# Patient Record
Sex: Male | Born: 1940
Health system: Southern US, Community
[De-identification: ages and names within clinical notes are randomized; demographics above are authoritative.]

## PROBLEM LIST (undated history)

## (undated) DIAGNOSIS — I10 Essential (primary) hypertension: Secondary | ICD-10-CM

## (undated) DIAGNOSIS — I5043 Acute on chronic combined systolic (congestive) and diastolic (congestive) heart failure: Secondary | ICD-10-CM

## (undated) DIAGNOSIS — J449 Chronic obstructive pulmonary disease, unspecified: Secondary | ICD-10-CM

## (undated) DIAGNOSIS — I509 Heart failure, unspecified: Secondary | ICD-10-CM

## (undated) DIAGNOSIS — I4891 Unspecified atrial fibrillation: Secondary | ICD-10-CM

## (undated) DIAGNOSIS — M1A00X0 Idiopathic chronic gout, unspecified site, without tophus (tophi): Secondary | ICD-10-CM

## (undated) DIAGNOSIS — M109 Gout, unspecified: Secondary | ICD-10-CM

## (undated) DIAGNOSIS — J189 Pneumonia, unspecified organism: Secondary | ICD-10-CM

## (undated) DIAGNOSIS — Z8719 Personal history of other diseases of the digestive system: Secondary | ICD-10-CM

## (undated) HISTORY — PX: LUMBAR DISC SURGERY: SHX700

## (undated) HISTORY — DX: Idiopathic chronic gout, unspecified site, without tophus (tophi): M1A.00X0

## (undated) HISTORY — PX: BACK SURGERY: SHX140

## (undated) HISTORY — DX: Acute on chronic combined systolic (congestive) and diastolic (congestive) heart failure: I50.43

---

## 1998-10-27 HISTORY — PX: CATARACT EXTRACTION W/ INTRAOCULAR LENS  IMPLANT, BILATERAL: SHX1307

## 2014-01-04 ENCOUNTER — Inpatient Hospital Stay (HOSPITAL_COMMUNITY)
Admission: EM | Admit: 2014-01-04 | Discharge: 2014-01-08 | DRG: 308 | Disposition: A | Discharge status: Home / Self Care | Payer: Medicare HMO | Attending: Internal Medicine | Admitting: Internal Medicine

## 2014-01-04 ENCOUNTER — Encounter (HOSPITAL_COMMUNITY): Payer: Self-pay | Admitting: *Deleted

## 2014-01-04 ENCOUNTER — Emergency Department (HOSPITAL_COMMUNITY): Payer: Medicare HMO

## 2014-01-04 DIAGNOSIS — I4891 Unspecified atrial fibrillation: Secondary | ICD-10-CM | POA: Diagnosis present

## 2014-01-04 DIAGNOSIS — T671XXS Heat syncope, sequela: Secondary | ICD-10-CM

## 2014-01-04 DIAGNOSIS — H7092 Unspecified mastoiditis, left ear: Secondary | ICD-10-CM | POA: Diagnosis present

## 2014-01-04 DIAGNOSIS — E43 Unspecified severe protein-calorie malnutrition: Secondary | ICD-10-CM | POA: Diagnosis present

## 2014-01-04 DIAGNOSIS — I959 Hypotension, unspecified: Secondary | ICD-10-CM | POA: Diagnosis not present

## 2014-01-04 DIAGNOSIS — R55 Syncope and collapse: Secondary | ICD-10-CM | POA: Diagnosis present

## 2014-01-04 DIAGNOSIS — S0511XA Contusion of eyeball and orbital tissues, right eye, initial encounter: Secondary | ICD-10-CM | POA: Diagnosis present

## 2014-01-04 DIAGNOSIS — I5033 Acute on chronic diastolic (congestive) heart failure: Secondary | ICD-10-CM | POA: Diagnosis present

## 2014-01-04 DIAGNOSIS — I10 Essential (primary) hypertension: Secondary | ICD-10-CM | POA: Diagnosis present

## 2014-01-04 DIAGNOSIS — R0902 Hypoxemia: Secondary | ICD-10-CM | POA: Diagnosis not present

## 2014-01-04 DIAGNOSIS — F17210 Nicotine dependence, cigarettes, uncomplicated: Secondary | ICD-10-CM | POA: Diagnosis present

## 2014-01-04 DIAGNOSIS — J329 Chronic sinusitis, unspecified: Secondary | ICD-10-CM | POA: Diagnosis present

## 2014-01-04 DIAGNOSIS — E860 Dehydration: Secondary | ICD-10-CM | POA: Diagnosis present

## 2014-01-04 DIAGNOSIS — W1830XA Fall on same level, unspecified, initial encounter: Secondary | ICD-10-CM | POA: Diagnosis present

## 2014-01-04 DIAGNOSIS — M7989 Other specified soft tissue disorders: Secondary | ICD-10-CM

## 2014-01-04 DIAGNOSIS — S0083XA Contusion of other part of head, initial encounter: Secondary | ICD-10-CM | POA: Diagnosis present

## 2014-01-04 HISTORY — DX: Unspecified atrial fibrillation: I48.91

## 2014-01-04 HISTORY — DX: Essential (primary) hypertension: I10

## 2014-01-04 HISTORY — DX: Pneumonia, unspecified organism: J18.9

## 2014-01-04 HISTORY — DX: Gout, unspecified: M10.9

## 2014-01-04 HISTORY — DX: Heart failure, unspecified: I50.9

## 2014-01-04 LAB — CBC WITH DIFFERENTIAL/PLATELET
BASOS ABS: 0.1 10*3/uL (ref 0.0–0.1)
BASOS PCT: 1 % (ref 0–1)
EOS ABS: 0.1 10*3/uL (ref 0.0–0.7)
EOS PCT: 1 % (ref 0–5)
HCT: 54.6 % — ABNORMAL HIGH (ref 39.0–52.0)
HEMOGLOBIN: 19 g/dL — AB (ref 13.0–17.0)
Lymphocytes Relative: 9 % — ABNORMAL LOW (ref 12–46)
Lymphs Abs: 1 10*3/uL (ref 0.7–4.0)
MCH: 32 pg (ref 26.0–34.0)
MCHC: 34.8 g/dL (ref 30.0–36.0)
MCV: 92.1 fL (ref 78.0–100.0)
MONO ABS: 1.1 10*3/uL — AB (ref 0.1–1.0)
MONOS PCT: 10 % (ref 3–12)
Neutro Abs: 8.2 10*3/uL — ABNORMAL HIGH (ref 1.7–7.7)
Neutrophils Relative %: 79 % — ABNORMAL HIGH (ref 43–77)
Platelets: 206 10*3/uL (ref 150–400)
RBC: 5.93 MIL/uL — ABNORMAL HIGH (ref 4.22–5.81)
RDW: 14.8 % (ref 11.5–15.5)
WBC: 10.5 10*3/uL (ref 4.0–10.5)

## 2014-01-04 LAB — COMPREHENSIVE METABOLIC PANEL
ALT: 47 U/L (ref 0–53)
AST: 34 U/L (ref 0–37)
Albumin: 3.9 g/dL (ref 3.5–5.2)
Alkaline Phosphatase: 60 U/L (ref 39–117)
Anion gap: 14 (ref 5–15)
BILIRUBIN TOTAL: 0.7 mg/dL (ref 0.3–1.2)
BUN: 19 mg/dL (ref 6–23)
CHLORIDE: 97 meq/L (ref 96–112)
CO2: 26 mEq/L (ref 19–32)
Calcium: 9.6 mg/dL (ref 8.4–10.5)
Creatinine, Ser: 0.88 mg/dL (ref 0.50–1.35)
GFR calc non Af Amer: 84 mL/min — ABNORMAL LOW (ref >90)
Glucose, Bld: 142 mg/dL — ABNORMAL HIGH (ref 70–99)
Potassium: 4.3 mEq/L (ref 3.7–5.3)
SODIUM: 137 meq/L (ref 137–147)
Total Protein: 7.6 g/dL (ref 6.0–8.3)

## 2014-01-04 LAB — PRO B NATRIURETIC PEPTIDE: Pro B Natriuretic peptide (BNP): 307.3 pg/mL — ABNORMAL HIGH (ref 0–125)

## 2014-01-04 LAB — TROPONIN I
Troponin I: <0.3 ng/mL (ref <0.30)
Troponin I: <0.3 ng/mL (ref <0.30)
Troponin I: <0.3 ng/mL (ref <0.30)

## 2014-01-04 LAB — TSH: TSH: 1.5 u[IU]/mL (ref 0.350–4.500)

## 2014-01-04 LAB — CBG MONITORING, ED: GLUCOSE-CAPILLARY: 145 mg/dL — AB (ref 70–99)

## 2014-01-04 LAB — PROTIME-INR
INR: 1.07 (ref 0.00–1.49)
Prothrombin Time: 14.1 seconds (ref 11.6–15.2)

## 2014-01-04 MED ORDER — ACETAMINOPHEN 325 MG PO TABS: 650.0000 mg | ORAL_TABLET | Freq: Once | ORAL | Status: DC

## 2014-01-04 MED ORDER — METOPROLOL TARTRATE 50 MG PO TABS
50.0000 mg | ORAL_TABLET | Freq: Two times a day (BID) | ORAL | Status: DC
  Administered 2014-01-04 – 2014-01-05 (×2): 50 mg via ORAL
  Filled 2014-01-04 (×5): qty 1

## 2014-01-04 MED ORDER — BENAZEPRIL HCL 20 MG PO TABS
20.0000 mg | ORAL_TABLET | Freq: Every day | ORAL | Status: DC
  Administered 2014-01-04 – 2014-01-05 (×2): 20 mg via ORAL
  Filled 2014-01-04 (×3): qty 1

## 2014-01-04 MED ORDER — ALBUTEROL SULFATE (2.5 MG/3ML) 0.083% IN NEBU
2.5000 mg | INHALATION_SOLUTION | Freq: Four times a day (QID) | RESPIRATORY_TRACT | Status: DC
  Administered 2014-01-04 – 2014-01-06 (×3): 2.5 mg via RESPIRATORY_TRACT
  Filled 2014-01-04 (×6): qty 3

## 2014-01-04 MED ORDER — ONDANSETRON HCL 4 MG/2ML IJ SOLN: 4.0000 mg | Freq: Four times a day (QID) | INTRAMUSCULAR | Status: DC | PRN

## 2014-01-04 MED ORDER — HYDROCODONE-ACETAMINOPHEN 5-325 MG PO TABS: 1.0000 | ORAL_TABLET | ORAL | Status: DC | PRN

## 2014-01-04 MED ORDER — FUROSEMIDE 10 MG/ML IJ SOLN
40.0000 mg | Freq: Two times a day (BID) | INTRAMUSCULAR | Status: DC
  Administered 2014-01-05 – 2014-01-07 (×5): 40 mg via INTRAVENOUS
  Filled 2014-01-04 (×7): qty 4

## 2014-01-04 MED ORDER — POLYETHYLENE GLYCOL 3350 17 G PO PACK
17.0000 g | PACK | Freq: Every day | ORAL | Status: DC | PRN
  Filled 2014-01-04: qty 1

## 2014-01-04 MED ORDER — ONDANSETRON HCL 4 MG PO TABS: 4.0000 mg | ORAL_TABLET | Freq: Four times a day (QID) | ORAL | Status: DC | PRN

## 2014-01-04 MED ORDER — SODIUM CHLORIDE 0.9 % IJ SOLN
3.0000 mL | Freq: Two times a day (BID) | INTRAMUSCULAR | Status: DC
  Administered 2014-01-04 – 2014-01-08 (×8): 3 mL via INTRAVENOUS

## 2014-01-04 MED ORDER — HYDROCODONE-ACETAMINOPHEN 10-325 MG PO TABS
1.0000 | ORAL_TABLET | Freq: Four times a day (QID) | ORAL | Status: DC
  Administered 2014-01-04 – 2014-01-08 (×15): 1 via ORAL
  Filled 2014-01-04 (×15): qty 1

## 2014-01-04 MED ORDER — METOPROLOL TARTRATE 1 MG/ML IV SOLN: 5.0000 mg | INTRAVENOUS | Status: DC | PRN

## 2014-01-04 MED ORDER — HEPARIN SODIUM (PORCINE) 5000 UNIT/ML IJ SOLN
5000.0000 [IU] | Freq: Three times a day (TID) | INTRAMUSCULAR | Status: DC
  Administered 2014-01-04 – 2014-01-06 (×6): 5000 [IU] via SUBCUTANEOUS
  Filled 2014-01-04 (×7): qty 1

## 2014-01-04 MED ORDER — ASPIRIN EC 81 MG PO TBEC
81.0000 mg | DELAYED_RELEASE_TABLET | Freq: Every day | ORAL | Status: DC
  Administered 2014-01-04 – 2014-01-08 (×5): 81 mg via ORAL
  Filled 2014-01-04 (×5): qty 1

## 2014-01-04 MED ORDER — GUAIFENESIN-DM 100-10 MG/5ML PO SYRP
5.0000 mL | ORAL_SOLUTION | ORAL | Status: DC | PRN
  Administered 2014-01-04 – 2014-01-07 (×5): 5 mL via ORAL
  Filled 2014-01-04 (×5): qty 5

## 2014-01-04 MED ORDER — FUROSEMIDE 10 MG/ML IJ SOLN
40.0000 mg | Freq: Once | INTRAMUSCULAR | Status: AC
  Administered 2014-01-04: 40 mg via INTRAVENOUS
  Filled 2014-01-04: qty 4

## 2014-01-04 MED ORDER — IPRATROPIUM-ALBUTEROL 0.5-2.5 (3) MG/3ML IN SOLN
3.0000 mL | Freq: Once | RESPIRATORY_TRACT | Status: AC
  Administered 2014-01-04: 3 mL via RESPIRATORY_TRACT
  Filled 2014-01-04: qty 3

## 2014-01-04 MED ORDER — CETYLPYRIDINIUM CHLORIDE 0.05 % MT LIQD
7.0000 mL | Freq: Two times a day (BID) | OROMUCOSAL | Status: DC
  Administered 2014-01-05 – 2014-01-08 (×7): 7 mL via OROMUCOSAL

## 2014-01-04 NOTE — ED Notes (Signed)
Patient was given a drink of water.

## 2014-01-04 NOTE — ED Provider Notes (Signed)
CSN: [id number]     Arrival date & time 01/04/14  1218 History   First MD Initiated Contact with Patient 01/04/14 1220     No chief complaint on file.    (Consider location/radiation/quality/duration/timing/severity/associated sxs/prior Treatment) HPI  73 year old male presents after a syncopal episode at home. Patient does not remember the event. EMS states family saw him walking without any prodrome passed out for approximate 30 seconds. Patient hit his head on the way down. Patient denies any shortness of breath or chest pain prior to or after the event. No headaches. Patient states last night he was very cold because his power had gone out for approximately 10 hours. He denies using any gas ears or other ancillary heating device. Patient has had a "wet cough" over the past 1 week. States he is a little more short of breath than usual but is usually very short of breath due to his smoking history. Denies any fevers. EMS states he was dizzy when they sat him up. He was found to have a-fib with RVR with rate between 130-170 and was given 20 mg diltiazem bolus and started on drip. HR then into 80s. Patient endorses leg swelling bilaterally for past 5-6 weeks, is currently taking diuretics.  No past medical history on file. No past surgical history on file. No family history on file. History  Substance Use Topics  . Smoking status: Not on file  . Smokeless tobacco: Not on file  . Alcohol Use: Not on file    Review of Systems  Constitutional: Negative for fever.  HENT: Negative for congestion and rhinorrhea.   Respiratory: Positive for cough and shortness of breath.   Cardiovascular: Positive for leg swelling. Negative for chest pain.  Gastrointestinal: Negative for vomiting and abdominal pain.  Neurological: Positive for syncope. Negative for weakness, numbness and headaches.  All other systems reviewed and are negative.     Allergies  Review of patient's allergies indicates not on  file.  Home Medications   Prior to Admission medications   Not on File   There were no vitals taken for this visit. Physical Exam  Constitutional: He is oriented to person, place, and time. He appears well-developed and well-nourished.  HENT:  Head: Normocephalic.    Right Ear: External ear normal.  Left Ear: External ear normal.  Nose: Nose normal.  Eyes: EOM are normal. Pupils are equal, round, and reactive to light. Right eye exhibits no discharge. Left eye exhibits no discharge.  Neck: Neck supple.  Cardiovascular: Normal rate, regular rhythm, normal heart sounds and intact distal pulses.   Pulmonary/Chest: Effort normal. He has decreased breath sounds. He has wheezes.  Abdominal: Soft. He exhibits no distension. There is no tenderness. A hernia is present. Hernia confirmed positive in the ventral area (reducible, no tenderness).  Musculoskeletal: He exhibits edema (bilateral pitting edema in lower extremities, 3+).  Neurological: He is alert and oriented to person, place, and time.  CN 2-12 grossly intact. 5/5 strength in all 4 extremities  Skin: Skin is warm and dry.  Nursing note and vitals reviewed.   ED Course  Procedures (including critical care time) Labs Review Labs Reviewed  COMPREHENSIVE METABOLIC PANEL - Abnormal; Notable for the following:    Glucose, Bld 142 (*)    GFR calc non Af Amer 84 (*)    All other components within normal limits  PRO B NATRIURETIC PEPTIDE - Abnormal; Notable for the following:    Pro B Natriuretic peptide (BNP) 307.3 (*)  All other components within normal limits  CBC WITH DIFFERENTIAL - Abnormal; Notable for the following:    RBC 5.93 (*)    Hemoglobin 19.0 (*)    HCT 54.6 (*)    Neutrophils Relative % 79 (*)    Neutro Abs 8.2 (*)    Lymphocytes Relative 9 (*)    Monocytes Absolute 1.1 (*)    All other components within normal limits  CBG MONITORING, ED - Abnormal; Notable for the following:    Glucose-Capillary 145 (*)      All other components within normal limits  TROPONIN I  PROTIME-INR  TROPONIN I  TROPONIN I  TROPONIN I  TSH  BASIC METABOLIC PANEL  CBC  CBC  CREATININE, SERUM    Imaging Review Ct Head Wo Contrast  01/04/2014   CLINICAL DATA:  Syncope. Fall with bruising to the left base. Initial encounter  EXAM: CT HEAD WITHOUT CONTRAST  CT MAXILLOFACIAL WITHOUT CONTRAST  TECHNIQUE: Multidetector CT imaging of the head and maxillofacial structures were performed using the standard protocol without intravenous contrast. Multiplanar CT image reconstructions of the maxillofacial structures were also generated.  COMPARISON:  None.  FINDINGS: CT HEAD FINDINGS  Skull and Sinuses:There is a subtotal opacification of left mastoid air cells without visible fracture. No septal erosion. There is lobulated soft tissue in the nasopharynx which is the presumed cause, although not asymmetric to the left. This soft tissue could reflect adenoid hypertrophy, but this is unexpected at this age. No erosive changes noted.  No calvarial fracture.  Brain: When accounting for prominent vein along the left cerebral convexity (Labbe) there is no evidence of subdural or other intracranial hemorrhage. No acute infarct, hydrocephalus, or mass lesion. Generalized cerebral volume loss which is likely age related. Very homogeneous triangular density along the right near over the [employer]. There is no evidence of previous craniotomy - this is presumably ossification.  CT MAXILLOFACIAL FINDINGS  Large soft tissue contusion in the left malar region. No acute fracture. There is a remote fracture of the left mandibular body, status post ORIF. No evidence of globe injury or postseptal hematoma. Patient is status post bilateral cataract resection.  Retropharyngeal atherosclerotic carotids.  Left mastoid effusion or mucosal thickening. There is nasopharyngeal lobulated soft tissue, although not eccentric to the left.  IMPRESSION: 1. No acute  intracranial findings. 2. Left facial contusion without facial fracture. 3. Left mastoiditis with thickening of nasopharyngeal soft tissues. Recommend the ENT referral for nasopharynx evaluation.   Electronically Signed   By: Jorje Guild M.D.   On: 01/04/2014 14:11   Dg Chest Port 1 View  01/04/2014   CLINICAL DATA:  73 year old with syncope, shortness of breath and cough  EXAM: PORTABLE CHEST - 1 VIEW  COMPARISON:  None.  FINDINGS: The cardiac silhouette is enlarged. The mediastinal contours are within normal limits. A few scattered atherosclerotic aortic calcifications are present.  There is no focal airspace consolidation, pleural effusion or pneumothorax. There is no overt pulmonary edema. The pulmonary vessels are mildly prominent. Mild bibasilar atelectasis is present.  No acute osseous abnormality is seen.  IMPRESSION: 1. Cardiomegaly without overt pulmonary edema or focal airspace consolidation. 2. Mild pulmonary vascular congestion. 3. Mild bibasilar atelectasis.   Electronically Signed   By: Rosemarie Ax   On: 01/04/2014 13:09   Ct Maxillofacial Wo Cm  01/04/2014   CLINICAL DATA:  Syncope. Fall with bruising to the left base. Initial encounter  EXAM: CT HEAD WITHOUT CONTRAST  CT MAXILLOFACIAL WITHOUT  CONTRAST  TECHNIQUE: Multidetector CT imaging of the head and maxillofacial structures were performed using the standard protocol without intravenous contrast. Multiplanar CT image reconstructions of the maxillofacial structures were also generated.  COMPARISON:  None.  FINDINGS: CT HEAD FINDINGS  Skull and Sinuses:There is a subtotal opacification of left mastoid air cells without visible fracture. No septal erosion. There is lobulated soft tissue in the nasopharynx which is the presumed cause, although not asymmetric to the left. This soft tissue could reflect adenoid hypertrophy, but this is unexpected at this age. No erosive changes noted.  No calvarial fracture.  Brain: When accounting for  prominent vein along the left cerebral convexity (Labbe) there is no evidence of subdural or other intracranial hemorrhage. No acute infarct, hydrocephalus, or mass lesion. Generalized cerebral volume loss which is likely age related. Very homogeneous triangular density along the right near over the [employer]. There is no evidence of previous craniotomy - this is presumably ossification.  CT MAXILLOFACIAL FINDINGS  Large soft tissue contusion in the left malar region. No acute fracture. There is a remote fracture of the left mandibular body, status post ORIF. No evidence of globe injury or postseptal hematoma. Patient is status post bilateral cataract resection.  Retropharyngeal atherosclerotic carotids.  Left mastoid effusion or mucosal thickening. There is nasopharyngeal lobulated soft tissue, although not eccentric to the left.  IMPRESSION: 1. No acute intracranial findings. 2. Left facial contusion without facial fracture. 3. Left mastoiditis with thickening of nasopharyngeal soft tissues. Recommend the ENT referral for nasopharynx evaluation.   Electronically Signed   By: Jorje Guild M.D.   On: 01/04/2014 14:11     EKG Interpretation   Date/Time:  Tuesday January 04 2014 12:38:19 EST Ventricular Rate:  98 PR Interval:    QRS Duration: 133 QT Interval:  382 QTC Calculation: 488 R Axis:   111 Text Interpretation:  Atrial fibrillation Right bundle branch block No old  tracing to compare Confirmed by [place]  MD, [place] ([id number]) on 01/04/2014  1:55:13 PM      MDM   Final diagnoses:  Syncope    Patient with syncope with no prodrome. Likely due to his A. Fib with RVR. This seems to be recurrent after talking with family. No evidence of acute CHF. The patient currently feels well and his A. Fib is controlled after 20 mg diltiazem by EMS. No recurrence RVR. Due to all this, however he will need admission for telemetry obs. Discussed with Dr. Candiss Norse, who will admit.    Ephraim Hamburger, MD 01/04/14 [phone]

## 2014-01-04 NOTE — Progress Notes (Signed)
No orders on patient. Dr. Candiss Norse called to make aware patient on unit. Dr. Candiss Norse states patient is not on list. Flow management called and message left.

## 2014-01-04 NOTE — Plan of Care (Signed)
Problem: Consults Goal: Diabetes Guidelines if Diabetic/Glucose > 140 If diabetic or lab glucose is > 140 mg/dl - Initiate Diabetes/Hyperglycemia Guidelines & Document Interventions  Outcome: Not Applicable Date Met:  01/04/14     

## 2014-01-04 NOTE — ED Notes (Signed)
Patient Brett Carey was [place] was informed.

## 2014-01-04 NOTE — H&P (Addendum)
Patient Demographics  Brett Carey, is a 73 y.o. male  MRN: [id number]   DOB - 12-16-1940  Admit Date - 01/04/2014  Outpatient Primary MD for the patient is Delphina Cahill, MD   With History of -  Past Medical History  Diagnosis Date  . Hypertension   . CHF (congestive heart failure)   . Gout       Past Surgical History  Procedure Laterality Date  . Back surgery      in for   Chief Complaint  Patient presents with  . Loss of Consciousness  . Shortness of Breath  . Irregular Heart Beat     HPI  Brett Carey  is a 73 y.o. male,  History of gout, CHF unspecified chronic, essential hypertension, back surgery, ongoing smoking counseled to quit who was doing fine this morning and went out to take his trash out, while he was coming back to sit on the recliner he passed out, he probably lost his consciousness for a few minutes, EMS was then called, they found him to be in atrial fibrillation RVR and brought him to ER.  In the ER patient was found to have right periorbital hematoma, he was in atrial fibrillation with a rate of around 100, he gives a history of worsening edema in his legs along with exertional shortness of breath and orthopnea, does have a dry cough, denies any chest pain or palpitations, no previous history of CAD or atrial fibrillation, no previous history of syncope, he denies having any seizure-like activity no bowel bladder incontinence or tongue bite. He currently feels fine. No headache, no chest pain, no palpitations, no abdominal pain, no diarrhea, no blood in stool or urine, no focal weakness.    Review of Systems    In addition to the HPI above,   No Fever-chills, No Headache, No changes with Vision or hearing, No problems swallowing food or Liquids, No Chest pain, Cough or  Shortness of Breath, No Abdominal pain, No Nausea or Vommitting, Bowel movements are regular, No Blood in stool or Urine, No dysuria, No new skin rashes or bruises, No new joints pains-aches,  No new weakness, tingling, numbness in any extremity, No recent weight gain or loss, No polyuria, polydypsia or polyphagia, No significant Mental Stressors.  A full 10 point Review of Systems was done, except as stated above, all other Review of Systems were negative.   Social History History  Substance Use Topics  . Smoking status: Current Every Day Smoker  . Smokeless tobacco: Not on file  . Alcohol Use: No      Family History No sudden cardiac death  Prior to Admission medications   Medication Sig Start Date End Date Taking? Authorizing Provider  amLODipine (NORVASC) 10 MG tablet Take 10 mg by mouth at bedtime. 10/29/13  Yes Historical Provider, MD  benazepril (LOTENSIN) 40 MG tablet Take 40 mg by mouth daily. 10/20/13  Yes Historical Provider, MD  furosemide (  LASIX) 20 MG tablet Take 20 mg by mouth daily. 11/08/13  Yes Historical Provider, MD  HYDROcodone-acetaminophen (NORCO) 10-325 MG per tablet Take 1 tablet by mouth every 6 (six) hours. 01/03/14  Yes Historical Provider, MD  metoprolol (LOPRESSOR) 50 MG tablet Take 50 mg by mouth 2 (two) times daily.   Yes Historical Provider, MD  PROAIR HFA 108 (90 BASE) MCG/ACT inhaler Take 2 puffs by mouth daily as needed. 11/08/13  Yes Historical Provider, MD    No Known Allergies  Physical Exam  Vitals  Blood pressure 129/99, pulse 86, temperature 98.1 F (36.7 C), temperature source Oral, resp. rate 20, height 5\' 4"  (1.626 m), weight 114.17 kg (251 lb 11.2 oz), SpO2 92 %.   1. General elderly white male lying in bed in NAD,    2. Normal affect and insight, Not Suicidal or Homicidal, Awake Alert, Oriented X 3.  3. No F.N deficits, ALL C.Nerves Intact, Strength 5/5 all 4 extremities, Sensation intact all 4 extremities, Plantars down  going.  4. Ears and Eyes appear Normal, Conjunctivae clear, PERRLA. Moist Oral Mucosa. Has right periorbital hematoma,  5. Supple Neck, elevated JVD, No cervical lymphadenopathy appriciated, No Carotid Bruits.  6. Symmetrical Chest wall movement, Good air movement bilaterally, bi basilar crackles,  7. Irregular RRR, No Gallops, Rubs or Murmurs, No Parasternal Heave.  8. Positive Bowel Sounds, Abdomen Soft, No tenderness, No organomegaly appriciated,No rebound -guarding or rigidity.  9.  No Cyanosis, Normal Skin Turgor, No Skin Rash or Bruise. 2+ leg edema left more than right,  10. Good muscle tone,  joints appear normal , no effusions, Normal ROM.  11. No Palpable Lymph Nodes in Neck or Axillae     Data Review  CBC  Recent Labs Lab 01/04/14 1251  WBC 10.5  HGB 19.0*  HCT 54.6*  PLT 206  MCV 92.1  MCH 32.0  MCHC 34.8  RDW 14.8  LYMPHSABS 1.0  MONOABS 1.1*  EOSABS 0.1  BASOSABS 0.1   ------------------------------------------------------------------------------------------------------------------  Chemistries   Recent Labs Lab 01/04/14 1251  NA 137  K 4.3  CL 97  CO2 26  GLUCOSE 142*  BUN 19  CREATININE 0.88  CALCIUM 9.6  AST 34  ALT 47  ALKPHOS 60  BILITOT 0.7   ------------------------------------------------------------------------------------------------------------------ estimated creatinine clearance is 88.4 mL/min (by C-G formula based on Cr of 0.88). ------------------------------------------------------------------------------------------------------------------ No results for input(s): TSH, T4TOTAL, T3FREE, THYROIDAB in the last 72 hours.  Invalid input(s): FREET3   Coagulation profile  Recent Labs Lab 01/04/14 1251  INR 1.07   ------------------------------------------------------------------------------------------------------------------- No results for input(s): DDIMER in the last 72  hours. -------------------------------------------------------------------------------------------------------------------  Cardiac Enzymes  Recent Labs Lab 01/04/14 1251  TROPONINI <0.30   ------------------------------------------------------------------------------------------------------------------ Invalid input(s): POCBNP   ---------------------------------------------------------------------------------------------------------------  Urinalysis No results found for: COLORURINE, APPEARANCEUR, LABSPEC, PHURINE, GLUCOSEU, HGBUR, BILIRUBINUR, KETONESUR, PROTEINUR, UROBILINOGEN, NITRITE, LEUKOCYTESUR  ----------------------------------------------------------------------------------------------------------------  Imaging results:   Ct Head Wo Contrast  01/04/2014   CLINICAL DATA:  Syncope. Fall with bruising to the left base. Initial encounter  EXAM: CT HEAD WITHOUT CONTRAST  CT MAXILLOFACIAL WITHOUT CONTRAST  TECHNIQUE: Multidetector CT imaging of the head and maxillofacial structures were performed using the standard protocol without intravenous contrast. Multiplanar CT image reconstructions of the maxillofacial structures were also generated.  COMPARISON:  None.  FINDINGS: CT HEAD FINDINGS  Skull and Sinuses:There is a subtotal opacification of left mastoid air cells without visible fracture. No septal erosion. There is lobulated soft tissue in the nasopharynx which is the presumed  cause, although not asymmetric to the left. This soft tissue could reflect adenoid hypertrophy, but this is unexpected at this age. No erosive changes noted.  No calvarial fracture.  Brain: When accounting for prominent vein along the left cerebral convexity (Labbe) there is no evidence of subdural or other intracranial hemorrhage. No acute infarct, hydrocephalus, or mass lesion. Generalized cerebral volume loss which is likely age related. Very homogeneous triangular density along the right near over the  [employer]. There is no evidence of previous craniotomy - this is presumably ossification.  CT MAXILLOFACIAL FINDINGS  Large soft tissue contusion in the left malar region. No acute fracture. There is a remote fracture of the left mandibular body, status post ORIF. No evidence of globe injury or postseptal hematoma. Patient is status post bilateral cataract resection.  Retropharyngeal atherosclerotic carotids.  Left mastoid effusion or mucosal thickening. There is nasopharyngeal lobulated soft tissue, although not eccentric to the left.  IMPRESSION: 1. No acute intracranial findings. 2. Left facial contusion without facial fracture. 3. Left mastoiditis with thickening of nasopharyngeal soft tissues. Recommend the ENT referral for nasopharynx evaluation.   Electronically Signed   By: Jorje Guild M.D.   On: 01/04/2014 14:11   Dg Chest Port 1 View  01/04/2014   CLINICAL DATA:  73 year old with syncope, shortness of breath and cough  EXAM: PORTABLE CHEST - 1 VIEW  COMPARISON:  None.  FINDINGS: The cardiac silhouette is enlarged. The mediastinal contours are within normal limits. A few scattered atherosclerotic aortic calcifications are present.  There is no focal airspace consolidation, pleural effusion or pneumothorax. There is no overt pulmonary edema. The pulmonary vessels are mildly prominent. Mild bibasilar atelectasis is present.  No acute osseous abnormality is seen.  IMPRESSION: 1. Cardiomegaly without overt pulmonary edema or focal airspace consolidation. 2. Mild pulmonary vascular congestion. 3. Mild bibasilar atelectasis.   Electronically Signed   By: Rosemarie Ax   On: 01/04/2014 13:09       My personal review of EKG: Rhythm Afib, Rate  98 /min,   no Acute ST changes    Assessment & Plan   1. Syncope likely secondary to new onset atrial fibrillation with RVR. Will be admitted to a telemetry bed, check TSH echogram, currently in rate control, on beta blocker which will be continued,  his CHADVASC2 score is 3 or above, he will require long-term and tach violation but currently has a significant right periorbital hematoma which is fresh, for now we'll put him on 81 mg aspirin, if hematoma tolerates aspirin and prophylaxis dose heparin full dose anticoagulation with either non-bolus heparin drip per Eliquis can be considered. We will monitor on telemetry.    2. Acute on chronic CHF nonspecific.  No previous echogram in chart, he has listed CHF but no known type, we'll place him on a heart healthy diet along with fluid restriction,  IV Lasix 40 mg BID , check echogram, continue beta blocker and ACE inhibitor. Monitor daily weights and intake and output.    3. Right facial hematoma and significant left mastoid nasopharyngeal thickening on CT scan. Outpatient ENT follow-up. No fracture noted.    4. Essential hypertension. Continue beta blocker & ACE inhibitor  Monitor.    5. Ongoing smoking. Counseled to quit.    6. Bilateral lower extremity swelling left more than right. Likely seconded to CHF, will check lower activity venous duplex to rule out DVT.      DVT Prophylaxis Heparin   AM Labs Ordered, also please  review Full Orders  Family Communication: Admission, patients condition and plan of care including tests being ordered have been discussed with the patient who indicates understanding and agree with the plan and Code Status.  Code Status Full  Likely DC to Home  Condition Fair  Time spent in minutes :35    SINGH,PRASHANT K M.D on 01/04/2014 at 5:25 PM  Between 7am to 7pm - Pager - [phone]  After 7pm go to [email or web address] - password [email or web address]  And look for the night coverage person covering me after hours  [facility] Office  [phone]

## 2014-01-04 NOTE — ED Notes (Signed)
Patient states increasing sob and edema to legs x few days, patient states he also has had a productive cough x few days, denies fevers/chills,. Patient passed out this am, patient with bruise to left side of face, denies any other injuries, patient in a-fib upon ems arrival, patient received 10 mg cardizem pta  Per ems

## 2014-01-04 NOTE — Progress Notes (Signed)
Patient has arrived to unit room 3E02. Patient alert and oriented and appears to be in no distress at this time. Patient has no complaints at this time.  Caridac monitor placed on patient. Patient appears to be in atrial fib with a rate of 92. Will monitor patient.

## 2014-01-04 NOTE — Plan of Care (Signed)
Problem: Consults Goal: Skin Care Protocol Initiated - if Braden Score 18 or less If consults are not indicated, leave blank or document N/A  Outcome: Not Applicable Date Met:  04/24/38

## 2014-01-04 NOTE — Progress Notes (Signed)
VASCULAR LAB PRELIMINARY  PRELIMINARY  PRELIMINARY  PRELIMINARY  Bilateral lower extremity venous duplex completed.    Preliminary report:  Bilateral:  No obvious evidence of DVT, superficial thrombosis, or Baker's Cyst.   Shashana Fullington, RVS 01/04/2014, 6:47 PM

## 2014-01-05 DIAGNOSIS — E43 Unspecified severe protein-calorie malnutrition: Secondary | ICD-10-CM

## 2014-01-05 DIAGNOSIS — I059 Rheumatic mitral valve disease, unspecified: Secondary | ICD-10-CM

## 2014-01-05 LAB — CBC
HEMATOCRIT: 52.2 % — AB (ref 39.0–52.0)
Hemoglobin: 18.4 g/dL — ABNORMAL HIGH (ref 13.0–17.0)
MCH: 32.1 pg (ref 26.0–34.0)
MCHC: 35.2 g/dL (ref 30.0–36.0)
MCV: 91.1 fL (ref 78.0–100.0)
PLATELETS: 196 10*3/uL (ref 150–400)
RBC: 5.73 MIL/uL (ref 4.22–5.81)
RDW: 15.1 % (ref 11.5–15.5)
WBC: 9.3 10*3/uL (ref 4.0–10.5)

## 2014-01-05 LAB — BASIC METABOLIC PANEL
Anion gap: 14 (ref 5–15)
BUN: 21 mg/dL (ref 6–23)
CO2: 23 mEq/L (ref 19–32)
CREATININE: 0.95 mg/dL (ref 0.50–1.35)
Calcium: 9.2 mg/dL (ref 8.4–10.5)
Chloride: 98 mEq/L (ref 96–112)
GFR calc Af Amer: >90 mL/min (ref >90)
GFR, EST NON AFRICAN AMERICAN: 82 mL/min — AB (ref >90)
Glucose, Bld: 113 mg/dL — ABNORMAL HIGH (ref 70–99)
Potassium: 4.2 mEq/L (ref 3.7–5.3)
Sodium: 135 mEq/L — ABNORMAL LOW (ref 137–147)

## 2014-01-05 LAB — TROPONIN I: Troponin I: <0.3 ng/mL (ref <0.30)

## 2014-01-05 MED ORDER — LIVING BETTER WITH HEART FAILURE BOOK
Freq: Once | Status: AC
  Administered 2014-01-05: 1

## 2014-01-05 MED ORDER — NICOTINE 14 MG/24HR TD PT24
14.0000 mg | MEDICATED_PATCH | Freq: Every day | TRANSDERMAL | Status: DC
  Administered 2014-01-05 – 2014-01-08 (×4): 14 mg via TRANSDERMAL
  Filled 2014-01-05 (×4): qty 1

## 2014-01-05 NOTE — Evaluation (Addendum)
Occupational Therapy Evaluation Patient Details Name: Brett Carey MRN: [id number] DOB: 10-10-1940 Today's Date: 01/05/2014    History of Present Illness 73 y.o. male,  History of gout, CHF unspecified chronic, essential hypertension, back surgery who presents after fall/syncopal episode at home.   Clinical Impression   Pt admitted with above. Pt independent with ADLs, PTA. Feel pt will benefit from acute OT to increase activity tolerance and independence prior to d/c.     Follow Up Recommendations  No OT follow up;Supervision - Intermittent    Equipment Recommendations  None recommended by OT    Recommendations for Other Services       Precautions / Restrictions Precautions Precautions: Fall Restrictions Weight Bearing Restrictions: No      Mobility Bed Mobility               General bed mobility comments: pt sitting EOB. not assessed  Transfers Overall transfer level: Needs assistance   Transfers: Sit to/from Stand Sit to Stand: Supervision              Balance                                            ADL Overall ADL's : Needs assistance/impaired     Grooming: Wash/dry face;Oral care;Applying deodorant;Brushing hair;Set up;Supervision/safety;Standing   Upper Body Bathing: Set up;Supervision/ safety;Standing   Lower Body Bathing: Set up;Supervison/ safety;Sit to/from stand   Upper Body Dressing : Set up;Sitting   Lower Body Dressing: Sit to/from stand;Set up;Supervision/safety   Toilet Transfer: Min guard;Ambulation (chair)   Toileting- Clothing Manipulation and Hygiene: Set up;Supervision/safety;Sit to/from stand   Tub/ Shower Transfer: Min guard;Ambulation (practiced stepping over)   Functional mobility during ADLs: Min guard General ADL Comments: Educated on energy conservation techniques and deep breathing technique. Pt with SOB during session so performed deep breathing technique. Educated on safety (safe  shoewear, rugs/clutter, sitting for most of LB ADLs, recommended wife be with him for tub transfer). Advised pt to quit smoking. Told pt not to get up in room by himself. Practiced simulated tub transfer.     Vision                     Perception     Praxis      Pertinent Vitals/Pain Pain Assessment: No/denies pain; Pt on 4-5 L of O2 in session and O2 at 95% when at sink during ADLs.     Hand Dominance     Extremity/Trunk Assessment Upper Extremity Assessment Upper Extremity Assessment: Overall WFL for tasks assessed   Lower Extremity Assessment Lower Extremity Assessment: Defer to PT evaluation       Communication Communication Communication: No difficulties (mumbles)   Cognition Arousal/Alertness: Awake/alert Behavior During Therapy: WFL for tasks assessed/performed Overall Cognitive Status: Within Functional Limits for tasks assessed                     General Comments       Exercises       Shoulder Instructions      Home Living Family/patient expects to be discharged to:: Private residence Living Arrangements: Spouse/significant other Available Help at Discharge: Family;Available 24 hours/day Type of Home: Mobile home Home Access: Stairs to enter Entrance Stairs-Number of Steps: 3 Entrance Stairs-Rails: None Home Layout: One level     Bathroom Shower/Tub: Tub/shower unit   [employer]  Toilet: Standard (sink close)     Home Equipment: Walker - 2 wheels;Cane - single point (pt reports he has chair he can use in shower)          Prior Functioning/Environment Level of Independence: Independent             OT Diagnosis: Other (comment) (cardiopulmonary status limiting activity)   OT Problem List: Decreased activity tolerance;Impaired balance (sitting and/or standing);Decreased knowledge of use of DME or AE;Decreased knowledge of precautions;Decreased safety awareness;Cardiopulmonary status limiting activity   OT  Treatment/Interventions: Self-care/ADL training;DME and/or AE instruction;Energy conservation;Therapeutic activities;Patient/family education;Balance training    OT Goals(Current goals can be found in the care plan section) Acute Rehab OT Goals Patient Stated Goal: not stated OT Goal Formulation: With patient Time For Goal Achievement: 01/12/14 Potential to Achieve Goals: Good ADL Goals Pt Will Perform Upper Body Bathing: with modified independence;standing;sitting Pt Will Perform Lower Body Bathing: with modified independence;sit to/from stand Pt Will Transfer to Toilet: with modified independence;ambulating Additional ADL Goal #1: Pt will verbalize and demonstrate 3/3 energy conservation techniques.  OT Frequency: Min 2X/week   Barriers to D/C:            Co-evaluation              End of Session Equipment Utilized During Treatment: Gait belt;Oxygen  Activity Tolerance: Patient tolerated treatment well Patient left: in bed;with call bell/phone within reach;with bed alarm set   Time: 1730-1757 OT Time Calculation (min): 27 min Charges:  OT General Charges $OT Visit: 1 Procedure OT Evaluation $Initial OT Evaluation Tier I: 1 Procedure OT Treatments $Self Care/Home Management : 8-22 mins G-CodesBenito Mccreedy OTR/L [phone] 01/05/2014, 6:20 PM

## 2014-01-05 NOTE — Progress Notes (Signed)
Echocardiogram 2D Echocardiogram has been performed.  Brett Carey 01/05/2014, 3:15 PM

## 2014-01-05 NOTE — Progress Notes (Signed)
[facility] PROGRESS NOTE  Toddy Boyd OAC:[id number] DOB: September 20, 1940 DOA: 01/04/2014 PCP: Delphina Cahill, MD  Assessment/Plan: 1. Syncope likely secondary to new onset afib with RVR 1. Currently rate controlled 2. Now on beta blocker, as tolerated 3. CHADS-VASC2 of 3. Will need anticoagulation if no signs of worsening hematoma. Currently on ASA alone 2. Acute on chronic CHF 1. Pt is cont on lasix 40mg  iv bid 2. Thus far, 2500cc out overnight 3. R facial hematoma 1. Stable 2. Monitor for now 4. HTN 1. Stable 2. Monitor 5. Tobacco abuse 1. Cessation done at bedside 2. Pt agreeable to nicotine patch 6. B LE edema 1. Likely secondary to acute CHF per above 7. DVT prophylaxis 1. Heparin subQ  Code Status: Full Family Communication: Pt in room Disposition Plan: Pending   Consultants:    Procedures:    Antibiotics:    HPI/Subjective: No acute events noted overnight  Objective: Filed Vitals:   01/05/14 0141 01/05/14 0630 01/05/14 0831 01/05/14 0900  BP:   112/70 94/62  Pulse:   67 72  Temp: 98.3 F (36.8 C) 97.7 F (36.5 C)    TempSrc: Oral Oral    Resp: 20     Height:      Weight:      SpO2: 95% 99%      Intake/Output Summary (Last 24 hours) at 01/05/14 1256 Last data filed at 01/05/14 1045  Gross per 24 hour  Intake    600 ml  Output    575 ml  Net     25 ml   Filed Weights   01/04/14 1235 01/04/14 1632  Weight: 113.399 kg (250 lb) 114.17 kg (251 lb 11.2 oz)    Exam:   General:  Awake, in nad  Cardiovascular: regular, s1, s2  Respiratory: normal resp effort, no wheezing  Abdomen: soft,nondistended  Musculoskeletal: perfused, no clubbing   Data Reviewed: Basic Metabolic Panel:  Recent Labs Lab 01/04/14 1251 01/05/14 0505  NA 137 135*  K 4.3 4.2  CL 97 98  CO2 26 23  GLUCOSE 142* 113*  BUN 19 21  CREATININE 0.88 0.95  CALCIUM 9.6 9.2   Liver Function Tests:  Recent Labs Lab 01/04/14 1251  AST 34  ALT 47   ALKPHOS 60  BILITOT 0.7  PROT 7.6  ALBUMIN 3.9   No results for input(s): LIPASE, AMYLASE in the last 168 hours. No results for input(s): AMMONIA in the last 168 hours. CBC:  Recent Labs Lab 01/04/14 1251 01/05/14 0505  WBC 10.5 9.3  NEUTROABS 8.2*  --   HGB 19.0* 18.4*  HCT 54.6* 52.2*  MCV 92.1 91.1  PLT 206 196   Cardiac Enzymes:  Recent Labs Lab 01/04/14 1251 01/04/14 1843 01/04/14 2305 01/05/14 0505  TROPONINI <0.30 <0.30 <0.30 <0.30   BNP (last 3 results)  Recent Labs  01/04/14 1250  PROBNP 307.3*   CBG:  Recent Labs Lab 01/04/14 1248  GLUCAP 145*    No results found for this or any previous visit (from the past 240 hour(s)).   Studies: Ct Head Wo Contrast  01/04/2014   CLINICAL DATA:  Syncope. Fall with bruising to the left base. Initial encounter  EXAM: CT HEAD WITHOUT CONTRAST  CT MAXILLOFACIAL WITHOUT CONTRAST  TECHNIQUE: Multidetector CT imaging of the head and maxillofacial structures were performed using the standard protocol without intravenous contrast. Multiplanar CT image reconstructions of the maxillofacial structures were also generated.  COMPARISON:  None.  FINDINGS: CT HEAD FINDINGS  Skull and  Sinuses:There is a subtotal opacification of left mastoid air cells without visible fracture. No septal erosion. There is lobulated soft tissue in the nasopharynx which is the presumed cause, although not asymmetric to the left. This soft tissue could reflect adenoid hypertrophy, but this is unexpected at this age. No erosive changes noted.  No calvarial fracture.  Brain: When accounting for prominent vein along the left cerebral convexity (Labbe) there is no evidence of subdural or other intracranial hemorrhage. No acute infarct, hydrocephalus, or mass lesion. Generalized cerebral volume loss which is likely age related. Very homogeneous triangular density along the right near over the [employer]. There is no evidence of previous craniotomy - this is  presumably ossification.  CT MAXILLOFACIAL FINDINGS  Large soft tissue contusion in the left malar region. No acute fracture. There is a remote fracture of the left mandibular body, status post ORIF. No evidence of globe injury or postseptal hematoma. Patient is status post bilateral cataract resection.  Retropharyngeal atherosclerotic carotids.  Left mastoid effusion or mucosal thickening. There is nasopharyngeal lobulated soft tissue, although not eccentric to the left.  IMPRESSION: 1. No acute intracranial findings. 2. Left facial contusion without facial fracture. 3. Left mastoiditis with thickening of nasopharyngeal soft tissues. Recommend the ENT referral for nasopharynx evaluation.   Electronically Signed   By: Jorje Guild M.D.   On: 01/04/2014 14:11   Dg Chest Port 1 View  01/04/2014   CLINICAL DATA:  73 year old with syncope, shortness of breath and cough  EXAM: PORTABLE CHEST - 1 VIEW  COMPARISON:  None.  FINDINGS: The cardiac silhouette is enlarged. The mediastinal contours are within normal limits. A few scattered atherosclerotic aortic calcifications are present.  There is no focal airspace consolidation, pleural effusion or pneumothorax. There is no overt pulmonary edema. The pulmonary vessels are mildly prominent. Mild bibasilar atelectasis is present.  No acute osseous abnormality is seen.  IMPRESSION: 1. Cardiomegaly without overt pulmonary edema or focal airspace consolidation. 2. Mild pulmonary vascular congestion. 3. Mild bibasilar atelectasis.   Electronically Signed   By: Rosemarie Ax   On: 01/04/2014 13:09   Ct Maxillofacial Wo Cm  01/04/2014   CLINICAL DATA:  Syncope. Fall with bruising to the left base. Initial encounter  EXAM: CT HEAD WITHOUT CONTRAST  CT MAXILLOFACIAL WITHOUT CONTRAST  TECHNIQUE: Multidetector CT imaging of the head and maxillofacial structures were performed using the standard protocol without intravenous contrast. Multiplanar CT image reconstructions of  the maxillofacial structures were also generated.  COMPARISON:  None.  FINDINGS: CT HEAD FINDINGS  Skull and Sinuses:There is a subtotal opacification of left mastoid air cells without visible fracture. No septal erosion. There is lobulated soft tissue in the nasopharynx which is the presumed cause, although not asymmetric to the left. This soft tissue could reflect adenoid hypertrophy, but this is unexpected at this age. No erosive changes noted.  No calvarial fracture.  Brain: When accounting for prominent vein along the left cerebral convexity (Labbe) there is no evidence of subdural or other intracranial hemorrhage. No acute infarct, hydrocephalus, or mass lesion. Generalized cerebral volume loss which is likely age related. Very homogeneous triangular density along the right near over the [employer]. There is no evidence of previous craniotomy - this is presumably ossification.  CT MAXILLOFACIAL FINDINGS  Large soft tissue contusion in the left malar region. No acute fracture. There is a remote fracture of the left mandibular body, status post ORIF. No evidence of globe injury or postseptal hematoma. Patient is status  post bilateral cataract resection.  Retropharyngeal atherosclerotic carotids.  Left mastoid effusion or mucosal thickening. There is nasopharyngeal lobulated soft tissue, although not eccentric to the left.  IMPRESSION: 1. No acute intracranial findings. 2. Left facial contusion without facial fracture. 3. Left mastoiditis with thickening of nasopharyngeal soft tissues. Recommend the ENT referral for nasopharynx evaluation.   Electronically Signed   By: Jorje Guild M.D.   On: 01/04/2014 14:11    Scheduled Meds: . albuterol  2.5 mg Inhalation Q6H  . antiseptic oral rinse  7 mL Mouth Rinse BID  . aspirin EC  81 mg Oral Daily  . benazepril  20 mg Oral Daily  . furosemide  40 mg Intravenous BID  . heparin  5,000 Units Subcutaneous 3 times per day  . HYDROcodone-acetaminophen  1 tablet  Oral Q6H  . Living Better with Heart Failure Book   Does not apply Once  . metoprolol  50 mg Oral BID  . nicotine  14 mg Transdermal Daily  . sodium chloride  3 mL Intravenous Q12H   Continuous Infusions:   Principal Problem:   Syncope Active Problems:   Atrial fibrillation   Facial hematoma   Benign essential HTN  Time spent: 32min  Dewaun Kinzler, [facility] Pager [phone]. If 7PM-7AM, please contact night-coverage at [email or web address], password [facility] 01/05/2014, 12:56 PM  LOS: 1 day

## 2014-01-06 DIAGNOSIS — J9601 Acute respiratory failure with hypoxia: Secondary | ICD-10-CM

## 2014-01-06 LAB — BASIC METABOLIC PANEL
Anion gap: 11 (ref 5–15)
BUN: 18 mg/dL (ref 6–23)
CO2: 27 mEq/L (ref 19–32)
Calcium: 9.2 mg/dL (ref 8.4–10.5)
Chloride: 96 mEq/L (ref 96–112)
Creatinine, Ser: 0.86 mg/dL (ref 0.50–1.35)
GFR, EST NON AFRICAN AMERICAN: 85 mL/min — AB (ref >90)
Glucose, Bld: 111 mg/dL — ABNORMAL HIGH (ref 70–99)
POTASSIUM: 4.5 meq/L (ref 3.7–5.3)
Sodium: 134 mEq/L — ABNORMAL LOW (ref 137–147)

## 2014-01-06 MED ORDER — BENAZEPRIL HCL 20 MG PO TABS
20.0000 mg | ORAL_TABLET | ORAL | Status: AC
Start: 2014-01-06 — End: 2014-01-06
  Administered 2014-01-06: 20 mg via ORAL
  Filled 2014-01-06: qty 1

## 2014-01-06 MED ORDER — METOPROLOL TARTRATE 12.5 MG HALF TABLET
12.5000 mg | ORAL_TABLET | ORAL | Status: AC
  Administered 2014-01-06: 12.5 mg via ORAL
  Filled 2014-01-06: qty 1

## 2014-01-06 MED ORDER — METOPROLOL TARTRATE 50 MG PO TABS: 50.0000 mg | ORAL_TABLET | Freq: Two times a day (BID) | ORAL | Status: DC

## 2014-01-06 MED ORDER — BENAZEPRIL HCL 10 MG PO TABS
10.0000 mg | ORAL_TABLET | Freq: Every day | ORAL | Status: DC
  Filled 2014-01-06: qty 1

## 2014-01-06 MED ORDER — IPRATROPIUM-ALBUTEROL 0.5-2.5 (3) MG/3ML IN SOLN: 3.0000 mL | RESPIRATORY_TRACT | Status: DC | PRN

## 2014-01-06 MED ORDER — BENAZEPRIL HCL 20 MG PO TABS
20.0000 mg | ORAL_TABLET | Freq: Every day | ORAL | Status: DC
  Administered 2014-01-07: 20 mg via ORAL
  Filled 2014-01-06: qty 1

## 2014-01-06 MED ORDER — LEVALBUTEROL HCL 0.63 MG/3ML IN NEBU: 0.6300 mg | INHALATION_SOLUTION | RESPIRATORY_TRACT | Status: DC | PRN

## 2014-01-06 MED ORDER — METOPROLOL TARTRATE 12.5 MG HALF TABLET
12.5000 mg | ORAL_TABLET | Freq: Two times a day (BID) | ORAL | Status: DC
  Filled 2014-01-06 (×2): qty 1

## 2014-01-06 MED ORDER — METHYLPREDNISOLONE SODIUM SUCC 125 MG IJ SOLR
60.0000 mg | INTRAMUSCULAR | Status: DC
  Administered 2014-01-06 – 2014-01-07 (×2): 60 mg via INTRAVENOUS
  Filled 2014-01-06 (×2): qty 0.96

## 2014-01-06 MED ORDER — IPRATROPIUM-ALBUTEROL 0.5-2.5 (3) MG/3ML IN SOLN: 3.0000 mL | RESPIRATORY_TRACT | Status: DC

## 2014-01-06 MED ORDER — IPRATROPIUM-ALBUTEROL 0.5-2.5 (3) MG/3ML IN SOLN
3.0000 mL | RESPIRATORY_TRACT | Status: DC
  Administered 2014-01-06 (×3): 3 mL via RESPIRATORY_TRACT
  Filled 2014-01-06 (×3): qty 3

## 2014-01-06 MED ORDER — APIXABAN 5 MG PO TABS
5.0000 mg | ORAL_TABLET | Freq: Two times a day (BID) | ORAL | Status: DC
  Administered 2014-01-06 – 2014-01-08 (×5): 5 mg via ORAL
  Filled 2014-01-06 (×6): qty 1

## 2014-01-06 MED ORDER — IPRATROPIUM BROMIDE 0.02 % IN SOLN: 0.5000 mg | RESPIRATORY_TRACT | Status: DC | PRN

## 2014-01-06 MED ORDER — METOPROLOL TARTRATE 12.5 MG HALF TABLET
12.5000 mg | ORAL_TABLET | Freq: Two times a day (BID) | ORAL | Status: DC
  Administered 2014-01-06 – 2014-01-07 (×2): 12.5 mg via ORAL
  Filled 2014-01-06 (×3): qty 1

## 2014-01-06 NOTE — Care Management Note (Addendum)
    Page 1 of 2   01/11/2014     10:48:26 AM CARE MANAGEMENT NOTE 01/11/2014  Patient:  Newn[place],Ki   Account Number:  [id number]  Date Initiated:  01/06/2014  Documentation initiated by:  Mariann Laster  Subjective/Objective Assessment:   Snycope, fall r/t RVR, Hematoma under eye, CHF     Action/Plan:   CM to follow for disposition needs   Anticipated DC Date:  01/07/2014   Anticipated DC Plan:  [place]  CM consult  Medication Assistance      PAC Choice  NA   Choice offered to / List presented to:     DME arranged  [facility]      DME agency  [facility].        Status of service:  Completed, signed off Medicare Important Message given?  YES (If response is "NO", the following Medicare IM given date fields will be blank) Date Medicare IM given:  01/07/2014 Medicare IM given by:  Guadalupe Kerekes Date Additional Medicare IM given:   Additional Medicare IM given by:    Discharge Disposition:  HOME/SELF CARE  Per UR Regulation:  Reviewed for med. necessity/level of care/duration of stay  If discussed at [place] of Stay Meetings, dates discussed:    Comments:  Benefits Update [facility] Discharge: Per ---01/11/2014 6213 by Madelin Headings--- per rep at  [employer]: eliquis: $170.62 for 30 day retail/ no auth required/ patient in inital coverage patient can use any retail pharmacy CM faxed this update to Dr. Nelly Laurence office Follow-up Information Follow up with Arnoldo Lenis, MD On 01/13/2014. Specialty: Cardiology 9:20am with Dr Harl Bowie in [place] [address] [phone]     01/08/14 14:00 CM called [facility] DME rep, james to please deliver Home Oxygen tank to room prior to discharge.  No other CM needs were communicated.  Mariane Masters, BSN, CM [phone].  ---01/07/2014 1613 by Mariann Laster--- Insurance update: hard copy placed in shadow chart [employer] RX [place]  Rx  PCN [facility] GRP# [id number] [phone]) MCR RX Drug Coverage CMS [id number]  Benefits update per ---01/07/2014 1115 by Madelin Headings--- [place]  ---01/06/2014 1203 by Mariann Laster--- Benefits Check: apixaban (ELIQUIS) tablet 5 mg po   2 times daily Coverage, co-pay, authorizations, deductibles/donut hole, pharm requirements Thanks, Kieara Schwark RN, BSN, MSHL, CCM  Nurse - Case Manager,  ([facility] [place])  [phone]  01/06/2014   Leasia Swann RN, BSN, MSHL, CCM  Nurse - Case Manager,  (Unit [place])  [phone]  01/06/2014 Social:  From home with wife OT RECS:  None PT RECS:  Pending Specialty med Review:  Eliquis - Benefits check sent but incomplete medication coverage information;  CM spoke with patient but does not have insurance card.  States he will have wife to bring card tomorrow. CM instructed information needed in order to f/u on eliquis Benefits check. CM provided Eliquis 30 day free offer card to patient.  CM had difficulty maintaining patients attention and required re-directing several times during discussion. Dispo Plan:  Home / [place]

## 2014-01-06 NOTE — Evaluation (Signed)
Physical Therapy Evaluation Patient Details Name: Brett Carey MRN: [id number] DOB: 1940/06/23 Today's Date: 01/06/2014   History of Present Illness  73 y.o. male,  History of gout, CHF unspecified chronic, essential hypertension, back surgery who presents after fall/syncopal episode at home.  Clinical Impression  Pt admitted with above. Pt currently with functional limitations due to the deficits listed below (see PT Problem List).  Pt will benefit from skilled PT to increase their independence and safety with mobility to allow discharge home with family.    Follow Up Recommendations No PT follow up    Equipment Recommendations  None recommended by PT    Recommendations for Other Services       Precautions / Restrictions Precautions Precautions: Fall      Mobility  Bed Mobility Overal bed mobility: Modified Independent                Transfers Overall transfer level: Needs assistance Equipment used: None Transfers: Sit to/from Stand Sit to Stand: Supervision            Ambulation/Gait Ambulation/Gait assistance: Min guard Ambulation Distance (Feet): 250 Feet Assistive device: None Gait Pattern/deviations: Step-through pattern;Decreased step length - right;Decreased step length - left;Wide base of support Gait velocity: decr Gait velocity interpretation: Below normal speed for age/gender General Gait Details: Slightly unsteady but no overt loss of balance.  Stairs            Wheelchair Mobility    Modified Rankin (Stroke Patients Only)       Balance Overall balance assessment: Needs assistance Sitting-balance support: No upper extremity supported;Feet supported Sitting balance-Leahy Scale: Normal     Standing balance support: No upper extremity supported Standing balance-Leahy Scale: Good                               Pertinent Vitals/Pain Pain Assessment: No/denies pain    Home Living Family/patient expects to be  discharged to:: Private residence Living Arrangements: Spouse/significant other Available Help at Discharge: Family;Available 24 hours/day Type of Home: Mobile home Home Access: Stairs to enter Entrance Stairs-Rails: None Entrance Stairs-Number of Steps: 3 Home Layout: One level Home Equipment: Walker - 2 wheels;Cane - single point      Prior Function Level of Independence: Independent               Hand Dominance        Extremity/Trunk Assessment   Upper Extremity Assessment: Defer to OT evaluation           Lower Extremity Assessment: Generalized weakness         Communication   Communication: No difficulties  Cognition Arousal/Alertness: Awake/alert Behavior During Therapy: WFL for tasks assessed/performed Overall Cognitive Status: Within Functional Limits for tasks assessed                      General Comments      Exercises        Assessment/Plan    PT Assessment Patient needs continued PT services  PT Diagnosis Difficulty walking;Generalized weakness   PT Problem List Decreased strength;Decreased activity tolerance;Decreased balance;Decreased mobility;Cardiopulmonary status limiting activity  PT Treatment Interventions DME instruction;Balance training;Gait training;Functional mobility training;Therapeutic activities;Therapeutic exercise;Patient/family education   PT Goals (Current goals can be found in the Care Plan section) Acute Rehab PT Goals Patient Stated Goal: return home PT Goal Formulation: With patient Time For Goal Achievement: 01/13/14 Potential to Achieve Goals: Good  Frequency Min 3X/week   Barriers to discharge        Co-evaluation               End of Session   Activity Tolerance: Patient tolerated treatment well Patient left: in chair;with call bell/phone within reach Nurse Communication: Mobility status         Time: 1520-1540 PT Time Calculation (min) (ACUTE ONLY): 20 min   Charges:   PT  Evaluation $Initial PT Evaluation Tier I: 1 Procedure PT Treatments $Gait Training: 8-22 mins   PT G Codes:          Brett Carey 01/08/2014, 4:22 PM  [facility] PT [phone]

## 2014-01-06 NOTE — Progress Notes (Signed)
[facility] PROGRESS NOTE  Brett Carey LKT:[id number] DOB: 06-04-1940 DOA: 01/04/2014 PCP: Delphina Cahill, MD  Assessment/Plan: 1. Syncope likely secondary to new onset afib with RVR 1. Remains rate controlled 2. On beta blocker, as tolerated 3. CHADS-VASC2 of 3. Currently on ASA alone 4. Will start eliquis and monitor closely 2. Acute on chronic CHF 1. Pt is cont on lasix 40mg  iv bid 2. Thus far, wt  From 114->113kg 3. R facial hematoma 1. Stable 2. Monitor for now 4. HTN 1. Stable 2. Monitor 5. Tobacco abuse 1. Cessation done at bedside 2. Cont nicotine patch 6. B LE edema 1. Likely secondary to acute CHF per above 2. diurese 7. DVT prophylaxis 1. Heparin subQ 2. Transition to eliquis 8. Hypoxia 1. No wheezing on exam, but pt with long hx of tobacco abuse 2. Will start scheduled duoneb and IV steroids 3. Currently on 4L [place]  Code Status: Full Family Communication: Pt in room Disposition Plan: Pending   Consultants:    Procedures:    Antibiotics:    HPI/Subjective: Eager to go home  Objective: Filed Vitals:   01/06/14 0627 01/06/14 0721 01/06/14 0943 01/06/14 1024  BP: 113/68  152/113 100/67  Pulse: 84  58 80  Temp: 97.8 F (36.6 C)   98.5 F (36.9 C)  TempSrc: Oral   Oral  Resp:    20  Height:      Weight: 113.2 kg (249 lb 9 oz)     SpO2: 93% 97%  85%    Intake/Output Summary (Last 24 hours) at 01/06/14 1101 Last data filed at 01/06/14 0805  Gross per 24 hour  Intake    700 ml  Output   1550 ml  Net   -850 ml   Filed Weights   01/04/14 1235 01/04/14 1632 01/06/14 0627  Weight: 113.399 kg (250 lb) 114.17 kg (251 lb 11.2 oz) 113.2 kg (249 lb 9 oz)    Exam:   General:  Awake, in nad  Cardiovascular: regular, s1, s2  Respiratory: normal resp effort, no wheezing  Abdomen: soft,nondistended  Musculoskeletal: perfused, no clubbing   Data Reviewed: Basic Metabolic Panel:  Recent Labs Lab 01/04/14 1251 01/05/14 0505  01/06/14 0913  NA 137 135* 134*  K 4.3 4.2 4.5  CL 97 98 96  CO2 26 23 27   GLUCOSE 142* 113* 111*  BUN 19 21 18   CREATININE 0.88 0.95 0.86  CALCIUM 9.6 9.2 9.2   Liver Function Tests:  Recent Labs Lab 01/04/14 1251  AST 34  ALT 47  ALKPHOS 60  BILITOT 0.7  PROT 7.6  ALBUMIN 3.9   No results for input(s): LIPASE, AMYLASE in the last 168 hours. No results for input(s): AMMONIA in the last 168 hours. CBC:  Recent Labs Lab 01/04/14 1251 01/05/14 0505  WBC 10.5 9.3  NEUTROABS 8.2*  --   HGB 19.0* 18.4*  HCT 54.6* 52.2*  MCV 92.1 91.1  PLT 206 196   Cardiac Enzymes:  Recent Labs Lab 01/04/14 1251 01/04/14 1843 01/04/14 2305 01/05/14 0505  TROPONINI <0.30 <0.30 <0.30 <0.30   BNP (last 3 results)  Recent Labs  01/04/14 1250  PROBNP 307.3*   CBG:  Recent Labs Lab 01/04/14 1248  GLUCAP 145*    No results found for this or any previous visit (from the past 240 hour(s)).   Studies: Ct Head Wo Contrast  01/04/2014   CLINICAL DATA:  Syncope. Fall with bruising to the left base. Initial encounter  EXAM: CT HEAD WITHOUT  CONTRAST  CT MAXILLOFACIAL WITHOUT CONTRAST  TECHNIQUE: Multidetector CT imaging of the head and maxillofacial structures were performed using the standard protocol without intravenous contrast. Multiplanar CT image reconstructions of the maxillofacial structures were also generated.  COMPARISON:  None.  FINDINGS: CT HEAD FINDINGS  Skull and Sinuses:There is a subtotal opacification of left mastoid air cells without visible fracture. No septal erosion. There is lobulated soft tissue in the nasopharynx which is the presumed cause, although not asymmetric to the left. This soft tissue could reflect adenoid hypertrophy, but this is unexpected at this age. No erosive changes noted.  No calvarial fracture.  Brain: When accounting for prominent vein along the left cerebral convexity (Labbe) there is no evidence of subdural or other intracranial  hemorrhage. No acute infarct, hydrocephalus, or mass lesion. Generalized cerebral volume loss which is likely age related. Very homogeneous triangular density along the right near over the [employer]. There is no evidence of previous craniotomy - this is presumably ossification.  CT MAXILLOFACIAL FINDINGS  Large soft tissue contusion in the left malar region. No acute fracture. There is a remote fracture of the left mandibular body, status post ORIF. No evidence of globe injury or postseptal hematoma. Patient is status post bilateral cataract resection.  Retropharyngeal atherosclerotic carotids.  Left mastoid effusion or mucosal thickening. There is nasopharyngeal lobulated soft tissue, although not eccentric to the left.  IMPRESSION: 1. No acute intracranial findings. 2. Left facial contusion without facial fracture. 3. Left mastoiditis with thickening of nasopharyngeal soft tissues. Recommend the ENT referral for nasopharynx evaluation.   Electronically Signed   By: Jorje Guild M.D.   On: 01/04/2014 14:11   Dg Chest Port 1 View  01/04/2014   CLINICAL DATA:  73 year old with syncope, shortness of breath and cough  EXAM: PORTABLE CHEST - 1 VIEW  COMPARISON:  None.  FINDINGS: The cardiac silhouette is enlarged. The mediastinal contours are within normal limits. A few scattered atherosclerotic aortic calcifications are present.  There is no focal airspace consolidation, pleural effusion or pneumothorax. There is no overt pulmonary edema. The pulmonary vessels are mildly prominent. Mild bibasilar atelectasis is present.  No acute osseous abnormality is seen.  IMPRESSION: 1. Cardiomegaly without overt pulmonary edema or focal airspace consolidation. 2. Mild pulmonary vascular congestion. 3. Mild bibasilar atelectasis.   Electronically Signed   By: Rosemarie Ax   On: 01/04/2014 13:09   Ct Maxillofacial Wo Cm  01/04/2014   CLINICAL DATA:  Syncope. Fall with bruising to the left base. Initial encounter   EXAM: CT HEAD WITHOUT CONTRAST  CT MAXILLOFACIAL WITHOUT CONTRAST  TECHNIQUE: Multidetector CT imaging of the head and maxillofacial structures were performed using the standard protocol without intravenous contrast. Multiplanar CT image reconstructions of the maxillofacial structures were also generated.  COMPARISON:  None.  FINDINGS: CT HEAD FINDINGS  Skull and Sinuses:There is a subtotal opacification of left mastoid air cells without visible fracture. No septal erosion. There is lobulated soft tissue in the nasopharynx which is the presumed cause, although not asymmetric to the left. This soft tissue could reflect adenoid hypertrophy, but this is unexpected at this age. No erosive changes noted.  No calvarial fracture.  Brain: When accounting for prominent vein along the left cerebral convexity (Labbe) there is no evidence of subdural or other intracranial hemorrhage. No acute infarct, hydrocephalus, or mass lesion. Generalized cerebral volume loss which is likely age related. Very homogeneous triangular density along the right near over the [employer]. There is no evidence  of previous craniotomy - this is presumably ossification.  CT MAXILLOFACIAL FINDINGS  Large soft tissue contusion in the left malar region. No acute fracture. There is a remote fracture of the left mandibular body, status post ORIF. No evidence of globe injury or postseptal hematoma. Patient is status post bilateral cataract resection.  Retropharyngeal atherosclerotic carotids.  Left mastoid effusion or mucosal thickening. There is nasopharyngeal lobulated soft tissue, although not eccentric to the left.  IMPRESSION: 1. No acute intracranial findings. 2. Left facial contusion without facial fracture. 3. Left mastoiditis with thickening of nasopharyngeal soft tissues. Recommend the ENT referral for nasopharynx evaluation.   Electronically Signed   By: Jorje Guild M.D.   On: 01/04/2014 14:11    Scheduled Meds: . antiseptic oral rinse   7 mL Mouth Rinse BID  . apixaban  5 mg Oral BID  . aspirin EC  81 mg Oral Daily  . [START ON 01/07/2014] benazepril  20 mg Oral Daily  . furosemide  40 mg Intravenous BID  . HYDROcodone-acetaminophen  1 tablet Oral Q6H  . ipratropium-albuterol  3 mL Nebulization Q4H  . methylPREDNISolone (SOLU-MEDROL) injection  60 mg Intravenous Q24H  . metoprolol  12.5 mg Oral BID  . nicotine  14 mg Transdermal Daily  . sodium chloride  3 mL Intravenous Q12H   Continuous Infusions:   Principal Problem:   Syncope Active Problems:   Atrial fibrillation   Facial hematoma   Benign essential HTN  Time spent: 43min  Alauna Hayden, [place] [facility] Pager [phone]. If 7PM-7AM, please contact night-coverage at [email or web address], password [facility] 01/06/2014, 11:01 AM  LOS: 2 days

## 2014-01-06 NOTE — Progress Notes (Signed)
ANTICOAGULATION CONSULT NOTE - Initial Consult  Pharmacy Consult for Eliquis Indication: atrial fibrillation  No Known Allergies  Patient Measurements: Height: 5\' 4"  (162.6 cm) Weight: 249 lb 9 oz (113.2 kg) IBW/kg (Calculated) : 59.2  Vital Signs: Temp: 98.5 F (36.9 C) (11/12 1024) Temp Source: Oral (11/12 1024) BP: 100/67 mmHg (11/12 1024) Pulse Rate: 80 (11/12 1024)  Labs:  Recent Labs  01/04/14 1251 01/04/14 1843 01/04/14 2305 01/05/14 0505 01/06/14 0913  HGB 19.0*  --   --  18.4*  --   HCT 54.6*  --   --  52.2*  --   PLT 206  --   --  196  --   LABPROT 14.1  --   --   --   --   INR 1.07  --   --   --   --   CREATININE 0.88  --   --  0.95 0.86  TROPONINI <0.30 <0.30 <0.30 <0.30  --     Estimated Creatinine Clearance: 90 mL/min (by C-G formula based on Cr of 0.86).   Medical History: Past Medical History  Diagnosis Date  . Hypertension   . CHF (congestive heart failure)   . Gout   . Pneumonia X 2  . Atrial fibrillation with RVR 01/04/2014   Assessment:   Eliquis to begin today. SQ heparin stopped.   Right facial/peri-orbital hematoma noted stable.  [place] prior to admission, thought due to syncope.   Patient reports prior use of Coumadin, but he doesn't remember when or for what reason, other than "to this his blood". Also on EC Aspirin 81 mg daily, though he also reports that he doesn't take it every day.  Goal of Therapy:  appropriate Eliquis dose for renal function and indication Monitor platelets by anticoagulation protocol: Yes   Plan:   Eliquis 5 mg BID.  We discussed Eliquis use, precautions, potential drug interactions and monitoring.  Arty Baumgartner, [place] Pager: [phone] 01/06/2014,12:10 PM

## 2014-01-06 NOTE — Discharge Instructions (Signed)
Information on my medicine - ELIQUIS (apixaban)  This medication education was reviewed with me or my healthcare representative as part of my discharge preparation.  The pharmacist that spoke with me during my hospital stay was:  Arty Baumgartner, [facility]  Why was Eliquis prescribed for you? Eliquis was prescribed for you to reduce the risk of a blood clot forming that can cause a stroke if you have a medical condition called atrial fibrillation (a type of irregular heartbeat).  What do You need to know about Eliquis ? Take your 5 mg Eliquis  5 mg TWICE DAILY - one tablet in the morning and one tablet in the evening with or without food. If you have difficulty swallowing the tablet whole please discuss with your pharmacist how to take the medication safely.  Take Eliquis exactly as prescribed by your doctor and DO NOT stop taking Eliquis without talking to the doctor who prescribed the medication.  Stopping may increase your risk of developing a stroke.  Refill your prescription before you run out.  After discharge, you should have regular check-up appointments with your healthcare provider that is prescribing your Eliquis.  In the future your dose may need to be changed if your kidney function or weight changes by a significant amount or as you get older.  What do you do if you miss a dose? If you miss a dose, take it as soon as you remember on the same day and resume taking twice daily.  Do not take more than one dose of ELIQUIS at the same time to make up a missed dose.  Important Safety Information A possible side effect of Eliquis is bleeding. You should call your healthcare provider right away if you experience any of the following: ? Bleeding from an injury or your nose that does not stop. ? Unusual colored urine (red or dark brown) or unusual colored stools (red or black). ? Unusual bruising for unknown reasons. ? A serious fall or if you hit your head (even if there is no  bleeding).  Some medicines may interact with Eliquis and might increase your risk of bleeding or clotting while on Eliquis. To help avoid this, consult your healthcare provider or pharmacist prior to using any new prescription or non-prescription medications, including herbals, vitamins, non-steroidal anti-inflammatory drugs (NSAIDs) and supplements.  This website has more information on Eliquis (apixaban): http://www.eliquis.com/eliquis/home

## 2014-01-06 NOTE — Progress Notes (Signed)
Heart Failure Navigator Consult Note  Presentation: Brett Carey is a 73 y.o. male, History of gout, CHF unspecified chronic, essential hypertension, back surgery, ongoing smoking counseled to quit who was doing fine this morning and went out to take his trash out, while he was coming back to sit on the recliner he passed out, he probably lost his consciousness for a few minutes, EMS was then called, they found him to be in atrial fibrillation RVR and brought him to ER.  In the ER patient was found to have right periorbital hematoma, he was in atrial fibrillation with a rate of around 100, he gives a history of worsening edema in his legs along with exertional shortness of breath and orthopnea, does have a dry cough, denies any chest pain or palpitations, no previous history of CAD or atrial fibrillation, no previous history of syncope, he denies having any seizure-like activity no bowel bladder incontinence or tongue bite. He currently feels fine. No headache, no chest pain, no palpitations, no abdominal pain, no diarrhea, no blood in stool or urine, no focal weakness.   Past Medical History  Diagnosis Date  . Hypertension   . CHF (congestive heart failure)   . Gout   . Pneumonia X 2  . Atrial fibrillation with RVR 01/04/2014    History   Social History  . Marital Status: Married    Spouse Name: N/A    Number of Children: N/A  . Years of Education: N/A   Social History Main Topics  . Smoking status: Current Every Day Smoker -- 0.33 packs/day for 57 years    Types: Cigarettes  . Smokeless tobacco: Former Systems developer    Types: Chew     Comment: "never chewed much"  . Alcohol Use: Yes     Comment: "drank some; stopped in the 1970's"  . Drug Use: No  . Sexual Activity: Not Currently   Other Topics Concern  . None   Social History Narrative  . None    ECHO:Study Conclusions--01/05/14  - Left ventricle: The cavity size was normal. Wall thickness was increased in a pattern of  mild LVH. Septal-lateral dyssynchrony. The estimated ejection fraction was 55%. Indeterminant diastolic function. Although no diagnostic regional wall motion abnormality was identified, this possibility cannot be completely excluded on the basis of this study. - Ventricular septum: D-shaped interventricular septum suggestive of RV pressure/volume overload. - Aortic valve: There was no stenosis. - Aorta: Mildly dilated ascending aorta. Aortic root dimension: 33 mm (ED). Ascending aortic diameter: 39 mm (S). - Mitral valve: There was mild regurgitation. - Left atrium: The atrium was severely dilated. - Right ventricle: The cavity size was moderately dilated. Systolic function was mildly reduced. - Right atrium: The atrium was severely dilated. - Tricuspid valve: Peak RV-RA gradient (S): 22 mm Hg. - Pulmonary arteries: PA peak pressure: 37 mm Hg (S). - Systemic veins: IVC measured 2.3 cm with < 50% respirophasic variation, suggesting RA pressure 15 mmHg.  Impressions:  - The patient was in atrial fibrillation. Normal LV size with mild LV hypertrophy. Septal-lateral dyssynchrony. EF 55%. D-shaped interventricular septum suggestive of RV pressure/volume overload. Moderately dilated RV with mildly decreased systolic function. Mild pulmonary hypertension. Dilated IVC.  Transthoracic echocardiography. M-mode, complete 2D, spectral Doppler, and color Doppler. Birthdate: Patient birthdate: 1940-11-21. Age: Patient is 73 yr old. Sex: Gender: male. BMI: 43.2 kg/m^2. Blood pressure:   94/62 Patient status: Inpatient. Study date: Study   BNP    Component Value Date/Time   PROBNP 307.3*  01/04/2014 1250    Education Assessment and Provision:  Detailed education and instructions provided on heart failure disease management including the following:  Signs and symptoms of Heart Failure When to call the physician Importance of daily weights Low sodium  diet Fluid restriction Medication management Anticipated future follow-up appointments  Patient education given on each of the above topics.  Patient acknowledges understanding and acceptance of all instructions.  I spoke at length with patient's wife and daughter regarding his HF.  I reinforced the importance of daily weights and when to call the physician.  We also discussed a low sodium diet and high sodium foods to avoid.  His wife is adding salt to her cooking and they frequently eat "fried bologna sandwiches" and canned soups.  She acknowledges that they will make changes to their diet.  She would like for him to be seen outpatient at her cardiologist --Dr. Harl Bowie in [place].  I will arrange follow-up for him.  He will return to home with his wife and the daughter lives in very close proximity.   Education Materials:  "Living Better With Heart Failure" Booklet, Daily Weight Tracker Tool .   High Risk Criteria for Readmission and/or Poor Patient Outcomes:   EF <30%--No 55%  2 or more admissions in 6 months- No  Difficult social situation- No  Demonstrates medication noncompliance- No    Barriers of Care:  Knowledge of patient, wife and daughter of HF, compliance related to previous.  Discharge Planning:   Plans to discharge to home with wife and daughter lives close by.  Plans to see [facility] -Dr. Harl Bowie in [place].

## 2014-01-07 LAB — CBC
HCT: 49.6 % (ref 39.0–52.0)
Hemoglobin: 17 g/dL (ref 13.0–17.0)
MCH: 31.1 pg (ref 26.0–34.0)
MCHC: 34.3 g/dL (ref 30.0–36.0)
MCV: 90.7 fL (ref 78.0–100.0)
PLATELETS: 214 10*3/uL (ref 150–400)
RBC: 5.47 MIL/uL (ref 4.22–5.81)
RDW: 14.5 % (ref 11.5–15.5)
WBC: 12.9 10*3/uL — AB (ref 4.0–10.5)

## 2014-01-07 LAB — BASIC METABOLIC PANEL
ANION GAP: 12 (ref 5–15)
BUN: 25 mg/dL — ABNORMAL HIGH (ref 6–23)
CALCIUM: 9.4 mg/dL (ref 8.4–10.5)
CO2: 28 mEq/L (ref 19–32)
Chloride: 94 mEq/L — ABNORMAL LOW (ref 96–112)
Creatinine, Ser: 0.99 mg/dL (ref 0.50–1.35)
GFR, EST NON AFRICAN AMERICAN: 80 mL/min — AB (ref >90)
GLUCOSE: 104 mg/dL — AB (ref 70–99)
POTASSIUM: 5 meq/L (ref 3.7–5.3)
SODIUM: 134 meq/L — AB (ref 137–147)

## 2014-01-07 MED ORDER — FUROSEMIDE 40 MG PO TABS
40.0000 mg | ORAL_TABLET | Freq: Every day | ORAL | Status: DC
  Administered 2014-01-08: 40 mg via ORAL
  Filled 2014-01-07: qty 1

## 2014-01-07 MED ORDER — PREDNISONE 50 MG PO TABS
60.0000 mg | ORAL_TABLET | Freq: Every day | ORAL | Status: DC
  Filled 2014-01-07 (×2): qty 1

## 2014-01-07 MED ORDER — METOPROLOL TARTRATE 25 MG PO TABS
25.0000 mg | ORAL_TABLET | Freq: Two times a day (BID) | ORAL | Status: DC
  Administered 2014-01-07: 25 mg via ORAL
  Filled 2014-01-07 (×3): qty 1

## 2014-01-07 NOTE — Progress Notes (Signed)
[facility] PROGRESS NOTE  Brett Carey HGD:[id number] DOB: Apr 29, 1940 DOA: 01/04/2014 PCP: Brett Cahill, MD  Assessment/Plan: 1. Syncope likely secondary to new onset afib with RVR 1. Had been rate controlled, but HR noted to be in the 140-150's 2. On beta blocker, will increase dose as bp tolerates 3. CHADS-VASC2 of 3. Started eliquis 11/12 and tolerating thus far 2. Acute on chronic CHF 1. Pt was cont on lasix 40mg  iv bid 2. Wt remains stable at 113kg with slight bump in BUN, thus this may be pt's dry weight 3. Will transition to PO lasix daily beginning 11/14 3. R facial hematoma 1. Stable 2. Monitor for now 4. HTN 1. Mildly hypotensive 2. Will d/c ACEI to allow increased beta blocker per above 3. Monitor 5. Tobacco abuse 1. Cessation done at bedside 2. Cont nicotine patch 6. B LE edema 1. Likely secondary to acute CHF per above 2. diurese 7. DVT prophylaxis 1. Heparin subQ 2. Transition to eliquis 8. Hypoxia 1. No wheezing on exam, but pt with long hx of tobacco abuse 2. On nebs and steroids 3. Cont to wean O2 as tolerated 4. Pt meets home O2 requirements, will need education not to smoke with O2  Code Status: Full Family Communication: Pt in room, family at bedside Disposition Plan: Pending   Consultants:    Procedures:    Antibiotics:    HPI/Subjective: Still eager to go home.   Objective: Filed Vitals:   01/07/14 0522 01/07/14 1009 01/07/14 1355 01/07/14 1405  BP: 107/60 114/78 85/52 99/67   Pulse: 76 97 88   Temp: 97.7 F (36.5 C)  98.1 F (36.7 C)   TempSrc: Oral  Oral   Resp: 18  20   Height:      Weight: 113.898 kg (251 lb 1.6 oz)     SpO2: 94%  90%     Intake/Output Summary (Last 24 hours) at 01/07/14 1554 Last data filed at 01/07/14 1405  Gross per 24 hour  Intake   1900 ml  Output   1422 ml  Net    478 ml   Filed Weights   01/04/14 1632 01/06/14 0627 01/07/14 0522  Weight: 114.17 kg (251 lb 11.2 oz) 113.2 kg (249 lb 9  oz) 113.898 kg (251 lb 1.6 oz)    Exam:   General:  Awake, in nad  Cardiovascular: regular, s1, s2  Respiratory: normal resp effort, no wheezing  Abdomen: soft,nondistended  Musculoskeletal: perfused, no clubbing   Data Reviewed: Basic Metabolic Panel:  Recent Labs Lab 01/04/14 1251 01/05/14 0505 01/06/14 0913 01/07/14 0315  NA 137 135* 134* 134*  K 4.3 4.2 4.5 5.0  CL 97 98 96 94*  CO2 26 23 27 28   GLUCOSE 142* 113* 111* 104*  BUN 19 21 18  25*  CREATININE 0.88 0.95 0.86 0.99  CALCIUM 9.6 9.2 9.2 9.4   Liver Function Tests:  Recent Labs Lab 01/04/14 1251  AST 34  ALT 47  ALKPHOS 60  BILITOT 0.7  PROT 7.6  ALBUMIN 3.9   No results for input(s): LIPASE, AMYLASE in the last 168 hours. No results for input(s): AMMONIA in the last 168 hours. CBC:  Recent Labs Lab 01/04/14 1251 01/05/14 0505 01/07/14 0315  WBC 10.5 9.3 12.9*  NEUTROABS 8.2*  --   --   HGB 19.0* 18.4* 17.0  HCT 54.6* 52.2* 49.6  MCV 92.1 91.1 90.7  PLT 206 196 214   Cardiac Enzymes:  Recent Labs Lab 01/04/14 1251 01/04/14 1843 01/04/14 2305  01/05/14 0505  TROPONINI <0.30 <0.30 <0.30 <0.30   BNP (last 3 results)  Recent Labs  01/04/14 1250  PROBNP 307.3*   CBG:  Recent Labs Lab 01/04/14 1248  GLUCAP 145*    No results found for this or any previous visit (from the past 240 hour(s)).   Studies: No results found.  Scheduled Meds: . antiseptic oral rinse  7 mL Mouth Rinse BID  . apixaban  5 mg Oral BID  . aspirin EC  81 mg Oral Daily  . [START ON 01/08/2014] furosemide  40 mg Oral Daily  . HYDROcodone-acetaminophen  1 tablet Oral Q6H  . metoprolol  25 mg Oral BID  . nicotine  14 mg Transdermal Daily  . [START ON 01/08/2014] predniSONE  60 mg Oral Q breakfast  . sodium chloride  3 mL Intravenous Q12H   Continuous Infusions:   Principal Problem:   Syncope Active Problems:   Atrial fibrillation   Facial hematoma   Benign essential HTN  Time spent:  82min  Brett Carey, [place] [facility] Pager [phone]. If 7PM-7AM, please contact night-coverage at [email or web address], password [facility] 01/07/2014, 3:54 PM  LOS: 3 days

## 2014-01-07 NOTE — Progress Notes (Signed)
Have reviewed the heart monitor strip from 1237 with attending physician.

## 2014-01-07 NOTE — Progress Notes (Signed)
Occupational Therapy Treatment and Discharge Patient Details Name: Brett Carey MRN: [id number] DOB: [birth year] Today's Date: 01/07/2014    History of present illness 73 y.o. male,  History of gout, CHF unspecified chronic, essential hypertension, back surgery who presents after fall/syncopal episode at home.   OT comments  This 73 yo male admitted with above presents to acute OT with all education completed with pt and family. Pt's sats at rest on 2 liters 91%, spoke with RN and liters increased from 2 liters to 3 liters. No further OT needs, we will sign off.  Follow Up Recommendations  No OT follow up;Supervision - Intermittent    Equipment Recommendations  None recommended by OT       Precautions / Restrictions Precautions Precautions: Fall Restrictions Weight Bearing Restrictions: No       Mobility Bed Mobility Overal bed mobility: Modified Independent                Transfers Overall transfer level: Independent Equipment used: None Transfers: Sit to/from Stand Sit to Stand: Independent                  ADL                                         General ADL Comments: Pt is able to move around his room and out into the hallway at an Mod independent level (no AD, a normal speed, cues for purse lipped breathing)--do not foresee any issues with pt being able to perform UB/LBB and toilet transfer at a Mod I level (increased time and recommended that he sit on a seat to shower). Educated on purse lipped breathing. Gave pt a handout on energy conservation with those that pertain to him the most highlighted                Cognition   Behavior During Therapy: [facility] for tasks assessed/performed Overall Cognitive Status: Within Functional Limits for tasks assessed                                    Pertinent Vitals/ Pain       Pain Assessment: No/denies pain         Frequency Min 2X/week     Progress Toward  Goals  OT Goals(current goals can now be found in the care plan section)  Progress towards OT goals:  (All education completed)     Plan Discharge plan remains appropriate       End of Session Equipment Utilized During Treatment: Oxygen   Activity Tolerance Patient tolerated treatment well   Patient Left with family/visitor present (sitting EOB)   Nurse Communication  (O2 sats)        Time: 1751-0258 OT Time Calculation (min): 26 min  Charges: OT General Charges $OT Visit: 1 Procedure OT Treatments $Self Care/Home Management : 23-37 mins  Almon Register [phone] 01/07/2014, 10:14 AM

## 2014-01-07 NOTE — Progress Notes (Signed)
SATURATION QUALIFICATIONS: (This note is used to comply with regulatory documentation for home oxygen)  Patient Saturations on Room Air at Rest = 90%  Patient Saturations on Room Air while Ambulating = 85%  Patient Saturations on 2 Liters of oxygen while Ambulating = 91%

## 2014-01-08 DIAGNOSIS — E860 Dehydration: Secondary | ICD-10-CM

## 2014-01-08 LAB — BASIC METABOLIC PANEL
ANION GAP: 12 (ref 5–15)
BUN: 29 mg/dL — ABNORMAL HIGH (ref 6–23)
CHLORIDE: 93 meq/L — AB (ref 96–112)
CO2: 30 mEq/L (ref 19–32)
CREATININE: 0.96 mg/dL (ref 0.50–1.35)
Calcium: 9.5 mg/dL (ref 8.4–10.5)
GFR calc non Af Amer: 81 mL/min — ABNORMAL LOW (ref >90)
Glucose, Bld: 97 mg/dL (ref 70–99)
Potassium: 5.6 mEq/L — ABNORMAL HIGH (ref 3.7–5.3)
SODIUM: 135 meq/L — AB (ref 137–147)

## 2014-01-08 MED ORDER — PREDNISONE 50 MG PO TABS
60.0000 mg | ORAL_TABLET | Freq: Every day | ORAL | Status: DC
  Administered 2014-01-08: 60 mg via ORAL
  Filled 2014-01-08 (×2): qty 1

## 2014-01-08 MED ORDER — APIXABAN 5 MG PO TABS: 5.0000 mg | ORAL_TABLET | Freq: Two times a day (BID) | ORAL | Status: DC

## 2014-01-08 MED ORDER — COLCHICINE 0.6 MG PO TABS: 0.6000 mg | ORAL_TABLET | Freq: Two times a day (BID) | ORAL | Status: DC

## 2014-01-08 MED ORDER — COLCHICINE 0.6 MG PO TABS
0.6000 mg | ORAL_TABLET | Freq: Two times a day (BID) | ORAL | Status: DC
  Administered 2014-01-08: 0.6 mg via ORAL
  Filled 2014-01-08 (×2): qty 1

## 2014-01-08 MED ORDER — COLCHICINE 0.6 MG PO TABS
0.6000 mg | ORAL_TABLET | Freq: Two times a day (BID) | ORAL | Status: AC
Start: 2014-01-08 — End: ?

## 2014-01-08 MED ORDER — FUROSEMIDE 40 MG PO TABS: 40.0000 mg | ORAL_TABLET | Freq: Every day | ORAL | Status: DC

## 2014-01-08 MED ORDER — PREDNISONE 20 MG PO TABS
60.0000 mg | ORAL_TABLET | Freq: Every day | ORAL | Status: DC
Start: 2014-01-08 — End: 2017-03-22

## 2014-01-08 NOTE — Progress Notes (Signed)
SATURATION QUALIFICATIONS: (This note is used to comply with regulatory documentation for home oxygen)  Patient Saturations on Room Air at Rest = 91%  Patient Saturations on Room Air while Ambulating = 88%  Patient Saturations on 2 Liters of oxygen while Ambulating =96 %  Please briefly explain why patient needs home oxygen: Pt SOB with ambulation

## 2014-01-08 NOTE — Discharge Summary (Addendum)
Physician Discharge Summary  Brett Carey ACZ:[id number] DOB: 1940/12/23 DOA: 01/04/2014  PCP: Brett Cahill, MD  Admit date: 01/04/2014 Discharge date: 01/08/2014  Time spent: 35 minutes  Recommendations for Outpatient Follow-up:  1. Follow up with PCP in 1-2 weeks 2. Follow up with Cardiology as scheduled 3. Weight on d/c 113kg 4. Repeat renal panel within one week  Discharge Diagnoses:  Principal Problem:   Syncope Active Problems:   Atrial fibrillation   Facial hematoma   Benign essential HTN   Discharge Condition: Improved  Diet recommendation: Heart healthy/low sodium  Filed Weights   01/06/14 0627 01/07/14 0522 01/08/14 0526  Weight: 113.2 kg (249 lb 9 oz) 113.898 kg (251 lb 1.6 oz) 113.082 kg (249 lb 4.8 oz)    History of present illness:  Please see admit h and p from 11/14 for details. Briefly, pt presents with sob, and syncope after being found to have afib with RVR. The patient was admitted for further work up.  Hospital Course:  1. Syncope likely secondary to new onset afib with RVR 1. Overall had been rate controlled, but HR intermittently was noted to be in the 140-150's 2. Pt was cont on beta blocker 3. CHADS-VASC2 of 3. Started eliquis 11/12 and tolerating thus far 4. ACEI and norvasc held secondary to low BP. Suspect this may be involved in presenting syncope as well 2. Acute on chronic possible diastolic CHF 1. Pt was cont on lasix 40mg  iv bid 2. Wt remains stable at 113kg with slight bump in BUN, thus this may be pt's dry weight 3. Transitioned to PO lasix daily beginning 11/14 4. Overall good diuresis, however, pt later admitted to voiding in the toilet and flushing, thus I/O is not entirely accurate 5. EF 55% on 2d echo, difficult to determine diastolic function 3. R facial hematoma 1. Stable 2. Monitor for now 4. HTN 1. Mildly hypotensive during this admit 2. d/c ACEI and norvasc to allow beta blocker per above 5. Tobacco abuse 1. Cessation  done at bedside 2. Cont nicotine patch 6. B LE edema 1. Likely secondary to acute CHF per above 2. Pt also has signs of chronic venous stasis, which may be a large factor in LE edema 7. DVT prophylaxis 1. Heparin subQ 2. Transitioned to eliquis 8. Hypoxia 1. No wheezing on exam, but pt with long hx of tobacco abuse 2. On nebs and steroids 3. Cont to wean O2 as tolerated 4. Pt meets home O2 requirements, will need education not to smoke with O2 5. Suspect pt has always been mildly hypoxic given his chronic tobacco abuse history  Discharge Exam: Filed Vitals:   01/08/14 0855 01/08/14 1110 01/08/14 1307 01/08/14 1409  BP: 111/72 80/60 133/72 131/82  Pulse: 105  85 58  Temp: 98.2 F (36.8 C)   97.5 F (36.4 C)  TempSrc: Oral   Oral  Resp: 18   18  Height:      Weight:      SpO2: 92%  91% 90%    General: Awake, in nad Cardiovascular: regular, s1, s2 Respiratory: normal resp effort, no wheezing  Discharge Instructions     Medication List    STOP taking these medications        amLODipine 10 MG tablet  Commonly known as:  NORVASC     benazepril 40 MG tablet  Commonly known as:  LOTENSIN      TAKE these medications        apixaban 5 MG Tabs tablet  Commonly known as:  ELIQUIS  Take 1 tablet (5 mg total) by mouth 2 (two) times daily.     colchicine 0.6 MG tablet  Commonly known as:  COLCRYS  Take 1 tablet (0.6 mg total) by mouth 2 (two) times daily.     furosemide 40 MG tablet  Commonly known as:  LASIX  Take 1 tablet (40 mg total) by mouth daily.     HYDROcodone-acetaminophen 10-325 MG per tablet  Commonly known as:  NORCO  Take 1 tablet by mouth every 6 (six) hours.     metoprolol 50 MG tablet  Commonly known as:  LOPRESSOR  Take 50 mg by mouth 2 (two) times daily.     predniSONE 20 MG tablet  Commonly known as:  DELTASONE  Take 3 tablets (60 mg total) by mouth daily with breakfast.     PROAIR HFA 108 (90 BASE) MCG/ACT inhaler  Generic drug:   albuterol  Take 2 puffs by mouth daily as needed.       No Known Allergies Follow-up Information    Follow up with Brett Lenis, MD On 01/13/2014.   Specialty:  Cardiology   Why:  at 9:20am with Dr Brett Carey in [facility] information:   Brett Carey [id number] [phone]       Follow up with Brett Cahill, MD. Schedule an appointment as soon as possible for a visit in 1 week.   Specialty:  Internal Medicine   Contact information:    [place] [address] [phone]        The results of significant diagnostics from this hospitalization (including imaging, microbiology, ancillary and laboratory) are listed below for reference.    Significant Diagnostic Studies: Ct Head Wo Contrast  01/04/2014   CLINICAL DATA:  Syncope. Fall with bruising to the left base. Initial encounter  EXAM: CT HEAD WITHOUT CONTRAST  CT MAXILLOFACIAL WITHOUT CONTRAST  TECHNIQUE: Multidetector CT imaging of the head and maxillofacial structures were performed using the standard protocol without intravenous contrast. Multiplanar CT image reconstructions of the maxillofacial structures were also generated.  COMPARISON:  None.  FINDINGS: CT HEAD FINDINGS  Skull and Sinuses:There is a subtotal opacification of left mastoid air cells without visible fracture. No septal erosion. There is lobulated soft tissue in the nasopharynx which is the presumed cause, although not asymmetric to the left. This soft tissue could reflect adenoid hypertrophy, but this is unexpected at this age. No erosive changes noted.  No calvarial fracture.  Brain: When accounting for prominent vein along the left cerebral convexity (Labbe) there is no evidence of subdural or other intracranial hemorrhage. No acute infarct, hydrocephalus, or mass lesion. Generalized cerebral volume loss which is likely age related. Very homogeneous triangular density along the right near over the [employer]. There is  no evidence of previous craniotomy - this is presumably ossification.  CT MAXILLOFACIAL FINDINGS  Large soft tissue contusion in the left malar region. No acute fracture. There is a remote fracture of the left mandibular body, status post ORIF. No evidence of globe injury or postseptal hematoma. Patient is status post bilateral cataract resection.  Retropharyngeal atherosclerotic carotids.  Left mastoid effusion or mucosal thickening. There is nasopharyngeal lobulated soft tissue, although not eccentric to the left.  IMPRESSION: 1. No acute intracranial findings. 2. Left facial contusion without facial fracture. 3. Left mastoiditis with thickening of nasopharyngeal soft tissues. Recommend the ENT referral for nasopharynx evaluation.  Electronically Signed   By: Jorje Guild M.D.   On: 01/04/2014 14:11   Dg Chest Port 1 View  01/04/2014   CLINICAL DATA:  73 year old with syncope, shortness of breath and cough  EXAM: PORTABLE CHEST - 1 VIEW  COMPARISON:  None.  FINDINGS: The cardiac silhouette is enlarged. The mediastinal contours are within normal limits. A few scattered atherosclerotic aortic calcifications are present.  There is no focal airspace consolidation, pleural effusion or pneumothorax. There is no overt pulmonary edema. The pulmonary vessels are mildly prominent. Mild bibasilar atelectasis is present.  No acute osseous abnormality is seen.  IMPRESSION: 1. Cardiomegaly without overt pulmonary edema or focal airspace consolidation. 2. Mild pulmonary vascular congestion. 3. Mild bibasilar atelectasis.   Electronically Signed   By: Rosemarie Ax   On: 01/04/2014 13:09   Ct Maxillofacial Wo Cm  01/04/2014   CLINICAL DATA:  Syncope. Fall with bruising to the left base. Initial encounter  EXAM: CT HEAD WITHOUT CONTRAST  CT MAXILLOFACIAL WITHOUT CONTRAST  TECHNIQUE: Multidetector CT imaging of the head and maxillofacial structures were performed using the standard protocol without intravenous  contrast. Multiplanar CT image reconstructions of the maxillofacial structures were also generated.  COMPARISON:  None.  FINDINGS: CT HEAD FINDINGS  Skull and Sinuses:There is a subtotal opacification of left mastoid air cells without visible fracture. No septal erosion. There is lobulated soft tissue in the nasopharynx which is the presumed cause, although not asymmetric to the left. This soft tissue could reflect adenoid hypertrophy, but this is unexpected at this age. No erosive changes noted.  No calvarial fracture.  Brain: When accounting for prominent vein along the left cerebral convexity (Labbe) there is no evidence of subdural or other intracranial hemorrhage. No acute infarct, hydrocephalus, or mass lesion. Generalized cerebral volume loss which is likely age related. Very homogeneous triangular density along the right near over the [employer]. There is no evidence of previous craniotomy - this is presumably ossification.  CT MAXILLOFACIAL FINDINGS  Large soft tissue contusion in the left malar region. No acute fracture. There is a remote fracture of the left mandibular body, status post ORIF. No evidence of globe injury or postseptal hematoma. Patient is status post bilateral cataract resection.  Retropharyngeal atherosclerotic carotids.  Left mastoid effusion or mucosal thickening. There is nasopharyngeal lobulated soft tissue, although not eccentric to the left.  IMPRESSION: 1. No acute intracranial findings. 2. Left facial contusion without facial fracture. 3. Left mastoiditis with thickening of nasopharyngeal soft tissues. Recommend the ENT referral for nasopharynx evaluation.   Electronically Signed   By: Jorje Guild M.D.   On: 01/04/2014 14:11    Microbiology: No results found for this or any previous visit (from the past 240 hour(s)).   Labs: Basic Metabolic Panel:  Recent Labs Lab 01/04/14 1251 01/05/14 0505 01/06/14 0913 01/07/14 0315 01/08/14 0407  NA 137 135* 134* 134*  135*  K 4.3 4.2 4.5 5.0 5.6*  CL 97 98 96 94* 93*  CO2 26 23 27 28 30   GLUCOSE 142* 113* 111* 104* 97  BUN 19 21 18  25* 29*  CREATININE 0.88 0.95 0.86 0.99 0.96  CALCIUM 9.6 9.2 9.2 9.4 9.5   Liver Function Tests:  Recent Labs Lab 01/04/14 1251  AST 34  ALT 47  ALKPHOS 60  BILITOT 0.7  PROT 7.6  ALBUMIN 3.9   No results for input(s): LIPASE, AMYLASE in the last 168 hours. No results for input(s): AMMONIA in the last 168 hours. CBC:  Recent Labs Lab 01/04/14 1251 01/05/14 0505 01/07/14 0315  WBC 10.5 9.3 12.9*  NEUTROABS 8.2*  --   --   HGB 19.0* 18.4* 17.0  HCT 54.6* 52.2* 49.6  MCV 92.1 91.1 90.7  PLT 206 196 214   Cardiac Enzymes:  Recent Labs Lab 01/04/14 1251 01/04/14 1843 01/04/14 2305 01/05/14 0505  TROPONINI <0.30 <0.30 <0.30 <0.30   BNP: BNP (last 3 results)  Recent Labs  01/04/14 1250  PROBNP 307.3*   CBG:  Recent Labs Lab 01/04/14 1248  GLUCAP 145*   Signed:  CHIU, STEPHEN K  [facility] 01/08/2014, 2:33 PM

## 2014-01-10 NOTE — Progress Notes (Signed)
UR completed / Retro.  Coreen Shippee K. Minal Stuller, RN, BSN, [place], CCM  01/10/2014 11:09 AM

## 2014-01-13 ENCOUNTER — Encounter: Payer: Medicare Other | Admitting: Cardiology

## 2014-01-17 ENCOUNTER — Encounter: Payer: Medicare Other | Admitting: Adult Health

## 2014-05-25 ENCOUNTER — Encounter (HOSPITAL_COMMUNITY)
Admission: RE | Admit: 2014-05-25 | Discharge: 2014-05-25 | Disposition: A | Discharge status: Home / Self Care | Payer: Medicare HMO | Source: Ambulatory Visit | Attending: Internal Medicine | Admitting: Internal Medicine

## 2014-05-25 DIAGNOSIS — D751 Secondary polycythemia: Secondary | ICD-10-CM | POA: Insufficient documentation

## 2014-05-25 NOTE — Progress Notes (Addendum)
Here for therapeutic phlebotomy x2 units. # 1 unit started at 0806 and completed at 0817. Unit wt 1.72 lb. Left arm.

## 2014-05-25 NOTE — Progress Notes (Signed)
0826-#2 therapeutic phlebotomy started right arm. 11.2 oz obtained. Blood no longer flowing. Completed at [phone]. Tolerated well.

## 2014-05-25 NOTE — Progress Notes (Signed)
D/C to home in good condition.  

## 2014-11-30 DIAGNOSIS — L258 Unspecified contact dermatitis due to other agents: Secondary | ICD-10-CM | POA: Diagnosis not present

## 2014-11-30 DIAGNOSIS — G894 Chronic pain syndrome: Secondary | ICD-10-CM | POA: Diagnosis not present

## 2014-11-30 DIAGNOSIS — I482 Chronic atrial fibrillation: Secondary | ICD-10-CM | POA: Diagnosis not present

## 2015-01-25 DIAGNOSIS — E782 Mixed hyperlipidemia: Secondary | ICD-10-CM | POA: Diagnosis not present

## 2015-01-25 DIAGNOSIS — I10 Essential (primary) hypertension: Secondary | ICD-10-CM | POA: Diagnosis not present

## 2015-01-25 DIAGNOSIS — E1165 Type 2 diabetes mellitus with hyperglycemia: Secondary | ICD-10-CM | POA: Diagnosis not present

## 2015-01-27 DIAGNOSIS — I482 Chronic atrial fibrillation: Secondary | ICD-10-CM | POA: Diagnosis not present

## 2015-01-27 DIAGNOSIS — I10 Essential (primary) hypertension: Secondary | ICD-10-CM | POA: Diagnosis not present

## 2015-01-27 DIAGNOSIS — G894 Chronic pain syndrome: Secondary | ICD-10-CM | POA: Diagnosis not present

## 2015-01-27 DIAGNOSIS — R7301 Impaired fasting glucose: Secondary | ICD-10-CM | POA: Diagnosis not present

## 2015-03-17 DIAGNOSIS — I482 Chronic atrial fibrillation: Secondary | ICD-10-CM | POA: Diagnosis not present

## 2015-03-17 DIAGNOSIS — G894 Chronic pain syndrome: Secondary | ICD-10-CM | POA: Diagnosis not present

## 2015-05-05 DIAGNOSIS — I10 Essential (primary) hypertension: Secondary | ICD-10-CM | POA: Diagnosis not present

## 2015-05-05 DIAGNOSIS — E1165 Type 2 diabetes mellitus with hyperglycemia: Secondary | ICD-10-CM | POA: Diagnosis not present

## 2015-05-05 DIAGNOSIS — E782 Mixed hyperlipidemia: Secondary | ICD-10-CM | POA: Diagnosis not present

## 2015-05-12 DIAGNOSIS — E1165 Type 2 diabetes mellitus with hyperglycemia: Secondary | ICD-10-CM | POA: Diagnosis not present

## 2015-05-12 DIAGNOSIS — D45 Polycythemia vera: Secondary | ICD-10-CM | POA: Diagnosis not present

## 2015-05-12 DIAGNOSIS — I10 Essential (primary) hypertension: Secondary | ICD-10-CM | POA: Diagnosis not present

## 2015-05-12 DIAGNOSIS — I482 Chronic atrial fibrillation: Secondary | ICD-10-CM | POA: Diagnosis not present

## 2015-08-11 DIAGNOSIS — I482 Chronic atrial fibrillation: Secondary | ICD-10-CM | POA: Diagnosis not present

## 2015-08-11 DIAGNOSIS — G894 Chronic pain syndrome: Secondary | ICD-10-CM | POA: Diagnosis not present

## 2015-08-11 DIAGNOSIS — Z720 Tobacco use: Secondary | ICD-10-CM | POA: Diagnosis not present

## 2015-08-11 DIAGNOSIS — D751 Secondary polycythemia: Secondary | ICD-10-CM | POA: Diagnosis not present

## 2015-09-21 DIAGNOSIS — M1388 Other specified arthritis, other site: Secondary | ICD-10-CM | POA: Diagnosis not present

## 2015-09-21 DIAGNOSIS — G629 Polyneuropathy, unspecified: Secondary | ICD-10-CM | POA: Diagnosis not present

## 2015-09-21 DIAGNOSIS — M109 Gout, unspecified: Secondary | ICD-10-CM | POA: Diagnosis not present

## 2015-09-21 DIAGNOSIS — J449 Chronic obstructive pulmonary disease, unspecified: Secondary | ICD-10-CM | POA: Diagnosis not present

## 2015-11-17 DIAGNOSIS — I10 Essential (primary) hypertension: Secondary | ICD-10-CM | POA: Diagnosis not present

## 2015-11-17 DIAGNOSIS — I482 Chronic atrial fibrillation: Secondary | ICD-10-CM | POA: Diagnosis not present

## 2015-11-17 DIAGNOSIS — D45 Polycythemia vera: Secondary | ICD-10-CM | POA: Diagnosis not present

## 2015-11-17 DIAGNOSIS — G894 Chronic pain syndrome: Secondary | ICD-10-CM | POA: Diagnosis not present

## 2015-11-17 DIAGNOSIS — Z125 Encounter for screening for malignant neoplasm of prostate: Secondary | ICD-10-CM | POA: Diagnosis not present

## 2015-11-17 DIAGNOSIS — E1165 Type 2 diabetes mellitus with hyperglycemia: Secondary | ICD-10-CM | POA: Diagnosis not present

## 2015-11-17 DIAGNOSIS — E782 Mixed hyperlipidemia: Secondary | ICD-10-CM | POA: Diagnosis not present

## 2015-11-17 DIAGNOSIS — R7301 Impaired fasting glucose: Secondary | ICD-10-CM | POA: Diagnosis not present

## 2016-02-15 DIAGNOSIS — G894 Chronic pain syndrome: Secondary | ICD-10-CM | POA: Diagnosis not present

## 2016-02-15 DIAGNOSIS — Z720 Tobacco use: Secondary | ICD-10-CM | POA: Diagnosis not present

## 2016-04-17 DIAGNOSIS — I482 Chronic atrial fibrillation: Secondary | ICD-10-CM | POA: Diagnosis not present

## 2016-04-17 DIAGNOSIS — G894 Chronic pain syndrome: Secondary | ICD-10-CM | POA: Diagnosis not present

## 2016-04-17 DIAGNOSIS — Z720 Tobacco use: Secondary | ICD-10-CM | POA: Diagnosis not present

## 2016-04-17 DIAGNOSIS — J449 Chronic obstructive pulmonary disease, unspecified: Secondary | ICD-10-CM | POA: Diagnosis not present

## 2016-04-17 DIAGNOSIS — I10 Essential (primary) hypertension: Secondary | ICD-10-CM | POA: Diagnosis not present

## 2016-04-18 DIAGNOSIS — Z6837 Body mass index (BMI) 37.0-37.9, adult: Secondary | ICD-10-CM | POA: Diagnosis not present

## 2016-04-18 DIAGNOSIS — I5033 Acute on chronic diastolic (congestive) heart failure: Secondary | ICD-10-CM | POA: Diagnosis not present

## 2016-04-18 DIAGNOSIS — Z79891 Long term (current) use of opiate analgesic: Secondary | ICD-10-CM | POA: Diagnosis not present

## 2016-04-18 DIAGNOSIS — M1A00X0 Idiopathic chronic gout, unspecified site, without tophus (tophi): Secondary | ICD-10-CM | POA: Diagnosis not present

## 2016-04-18 DIAGNOSIS — M545 Low back pain: Secondary | ICD-10-CM | POA: Diagnosis not present

## 2016-04-18 DIAGNOSIS — R69 Illness, unspecified: Secondary | ICD-10-CM | POA: Diagnosis not present

## 2016-04-18 DIAGNOSIS — Z0000 Encounter for general adult medical examination without abnormal findings: Secondary | ICD-10-CM | POA: Diagnosis not present

## 2016-04-18 DIAGNOSIS — I110 Hypertensive heart disease with heart failure: Secondary | ICD-10-CM | POA: Diagnosis not present

## 2016-04-18 DIAGNOSIS — K08409 Partial loss of teeth, unspecified cause, unspecified class: Secondary | ICD-10-CM | POA: Diagnosis not present

## 2016-07-18 DIAGNOSIS — E1165 Type 2 diabetes mellitus with hyperglycemia: Secondary | ICD-10-CM | POA: Diagnosis not present

## 2016-07-18 DIAGNOSIS — I10 Essential (primary) hypertension: Secondary | ICD-10-CM | POA: Diagnosis not present

## 2016-07-25 DIAGNOSIS — D72829 Elevated white blood cell count, unspecified: Secondary | ICD-10-CM | POA: Diagnosis not present

## 2016-07-25 DIAGNOSIS — E1165 Type 2 diabetes mellitus with hyperglycemia: Secondary | ICD-10-CM | POA: Diagnosis not present

## 2016-07-25 DIAGNOSIS — Z720 Tobacco use: Secondary | ICD-10-CM | POA: Diagnosis not present

## 2016-07-25 DIAGNOSIS — J449 Chronic obstructive pulmonary disease, unspecified: Secondary | ICD-10-CM | POA: Diagnosis not present

## 2016-07-25 DIAGNOSIS — D751 Secondary polycythemia: Secondary | ICD-10-CM | POA: Diagnosis not present

## 2016-07-25 DIAGNOSIS — I10 Essential (primary) hypertension: Secondary | ICD-10-CM | POA: Diagnosis not present

## 2016-07-25 DIAGNOSIS — G894 Chronic pain syndrome: Secondary | ICD-10-CM | POA: Diagnosis not present

## 2016-07-25 DIAGNOSIS — I482 Chronic atrial fibrillation: Secondary | ICD-10-CM | POA: Diagnosis not present

## 2016-07-25 DIAGNOSIS — R7301 Impaired fasting glucose: Secondary | ICD-10-CM | POA: Diagnosis not present

## 2016-10-23 DIAGNOSIS — R7301 Impaired fasting glucose: Secondary | ICD-10-CM | POA: Diagnosis not present

## 2016-10-23 DIAGNOSIS — I10 Essential (primary) hypertension: Secondary | ICD-10-CM | POA: Diagnosis not present

## 2016-10-25 DIAGNOSIS — I482 Chronic atrial fibrillation: Secondary | ICD-10-CM | POA: Diagnosis not present

## 2016-10-25 DIAGNOSIS — G894 Chronic pain syndrome: Secondary | ICD-10-CM | POA: Diagnosis not present

## 2016-10-25 DIAGNOSIS — I10 Essential (primary) hypertension: Secondary | ICD-10-CM | POA: Diagnosis not present

## 2016-10-25 DIAGNOSIS — R7301 Impaired fasting glucose: Secondary | ICD-10-CM | POA: Diagnosis not present

## 2016-10-25 DIAGNOSIS — Z720 Tobacco use: Secondary | ICD-10-CM | POA: Diagnosis not present

## 2016-10-25 DIAGNOSIS — D72829 Elevated white blood cell count, unspecified: Secondary | ICD-10-CM | POA: Diagnosis not present

## 2016-10-25 DIAGNOSIS — D751 Secondary polycythemia: Secondary | ICD-10-CM | POA: Diagnosis not present

## 2016-10-25 DIAGNOSIS — Z6837 Body mass index (BMI) 37.0-37.9, adult: Secondary | ICD-10-CM | POA: Diagnosis not present

## 2016-10-25 DIAGNOSIS — J449 Chronic obstructive pulmonary disease, unspecified: Secondary | ICD-10-CM | POA: Diagnosis not present

## 2017-01-15 DIAGNOSIS — J449 Chronic obstructive pulmonary disease, unspecified: Secondary | ICD-10-CM | POA: Diagnosis not present

## 2017-01-15 DIAGNOSIS — R69 Illness, unspecified: Secondary | ICD-10-CM | POA: Diagnosis not present

## 2017-01-15 DIAGNOSIS — I10 Essential (primary) hypertension: Secondary | ICD-10-CM | POA: Diagnosis not present

## 2017-01-15 DIAGNOSIS — Z6835 Body mass index (BMI) 35.0-35.9, adult: Secondary | ICD-10-CM | POA: Diagnosis not present

## 2017-01-15 DIAGNOSIS — G894 Chronic pain syndrome: Secondary | ICD-10-CM | POA: Diagnosis not present

## 2017-01-15 DIAGNOSIS — Z720 Tobacco use: Secondary | ICD-10-CM | POA: Diagnosis not present

## 2017-01-15 DIAGNOSIS — G4700 Insomnia, unspecified: Secondary | ICD-10-CM | POA: Diagnosis not present

## 2017-02-26 DIAGNOSIS — Z6836 Body mass index (BMI) 36.0-36.9, adult: Secondary | ICD-10-CM | POA: Diagnosis not present

## 2017-02-26 DIAGNOSIS — J449 Chronic obstructive pulmonary disease, unspecified: Secondary | ICD-10-CM | POA: Diagnosis not present

## 2017-02-26 DIAGNOSIS — I10 Essential (primary) hypertension: Secondary | ICD-10-CM | POA: Diagnosis not present

## 2017-02-26 DIAGNOSIS — R69 Illness, unspecified: Secondary | ICD-10-CM | POA: Diagnosis not present

## 2017-02-26 DIAGNOSIS — G4700 Insomnia, unspecified: Secondary | ICD-10-CM | POA: Diagnosis not present

## 2017-02-26 DIAGNOSIS — G894 Chronic pain syndrome: Secondary | ICD-10-CM | POA: Diagnosis not present

## 2017-02-26 DIAGNOSIS — Z5181 Encounter for therapeutic drug level monitoring: Secondary | ICD-10-CM | POA: Diagnosis not present

## 2017-03-20 ENCOUNTER — Inpatient Hospital Stay (HOSPITAL_COMMUNITY)
Admission: EM | Admit: 2017-03-20 | Discharge: 2017-04-02 | DRG: 193 | Disposition: A | Discharge status: Skilled Nursing Facility | Payer: Medicare HMO | Attending: Internal Medicine | Admitting: Internal Medicine

## 2017-03-20 ENCOUNTER — Encounter (HOSPITAL_COMMUNITY): Payer: Self-pay

## 2017-03-20 ENCOUNTER — Emergency Department (HOSPITAL_COMMUNITY): Payer: Medicare HMO

## 2017-03-20 DIAGNOSIS — R1312 Dysphagia, oropharyngeal phase: Secondary | ICD-10-CM | POA: Diagnosis not present

## 2017-03-20 DIAGNOSIS — J441 Chronic obstructive pulmonary disease with (acute) exacerbation: Secondary | ICD-10-CM | POA: Diagnosis not present

## 2017-03-20 DIAGNOSIS — R945 Abnormal results of liver function studies: Secondary | ICD-10-CM

## 2017-03-20 DIAGNOSIS — J9600 Acute respiratory failure, unspecified whether with hypoxia or hypercapnia: Secondary | ICD-10-CM | POA: Diagnosis not present

## 2017-03-20 DIAGNOSIS — Z8701 Personal history of pneumonia (recurrent): Secondary | ICD-10-CM | POA: Diagnosis not present

## 2017-03-20 DIAGNOSIS — R1310 Dysphagia, unspecified: Secondary | ICD-10-CM | POA: Diagnosis present

## 2017-03-20 DIAGNOSIS — Z7901 Long term (current) use of anticoagulants: Secondary | ICD-10-CM | POA: Diagnosis not present

## 2017-03-20 DIAGNOSIS — J189 Pneumonia, unspecified organism: Secondary | ICD-10-CM | POA: Diagnosis not present

## 2017-03-20 DIAGNOSIS — I482 Chronic atrial fibrillation: Secondary | ICD-10-CM | POA: Diagnosis not present

## 2017-03-20 DIAGNOSIS — I361 Nonrheumatic tricuspid (valve) insufficiency: Secondary | ICD-10-CM | POA: Diagnosis not present

## 2017-03-20 DIAGNOSIS — I10 Essential (primary) hypertension: Secondary | ICD-10-CM | POA: Diagnosis present

## 2017-03-20 DIAGNOSIS — I5033 Acute on chronic diastolic (congestive) heart failure: Secondary | ICD-10-CM | POA: Diagnosis not present

## 2017-03-20 DIAGNOSIS — I071 Rheumatic tricuspid insufficiency: Secondary | ICD-10-CM | POA: Diagnosis present

## 2017-03-20 DIAGNOSIS — R7989 Other specified abnormal findings of blood chemistry: Secondary | ICD-10-CM | POA: Diagnosis not present

## 2017-03-20 DIAGNOSIS — N179 Acute kidney failure, unspecified: Secondary | ICD-10-CM

## 2017-03-20 DIAGNOSIS — R748 Abnormal levels of other serum enzymes: Secondary | ICD-10-CM | POA: Diagnosis present

## 2017-03-20 DIAGNOSIS — M6281 Muscle weakness (generalized): Secondary | ICD-10-CM | POA: Diagnosis not present

## 2017-03-20 DIAGNOSIS — K7031 Alcoholic cirrhosis of liver with ascites: Secondary | ICD-10-CM | POA: Diagnosis present

## 2017-03-20 DIAGNOSIS — K7030 Alcoholic cirrhosis of liver without ascites: Secondary | ICD-10-CM | POA: Diagnosis not present

## 2017-03-20 DIAGNOSIS — J90 Pleural effusion, not elsewhere classified: Secondary | ICD-10-CM

## 2017-03-20 DIAGNOSIS — R740 Nonspecific elevation of levels of transaminase and lactic acid dehydrogenase [LDH]: Secondary | ICD-10-CM | POA: Diagnosis present

## 2017-03-20 DIAGNOSIS — R5383 Other fatigue: Secondary | ICD-10-CM | POA: Diagnosis not present

## 2017-03-20 DIAGNOSIS — R188 Other ascites: Secondary | ICD-10-CM

## 2017-03-20 DIAGNOSIS — Z7401 Bed confinement status: Secondary | ICD-10-CM | POA: Diagnosis not present

## 2017-03-20 DIAGNOSIS — J181 Lobar pneumonia, unspecified organism: Secondary | ICD-10-CM | POA: Diagnosis not present

## 2017-03-20 DIAGNOSIS — I110 Hypertensive heart disease with heart failure: Secondary | ICD-10-CM | POA: Diagnosis present

## 2017-03-20 DIAGNOSIS — I509 Heart failure, unspecified: Secondary | ICD-10-CM

## 2017-03-20 DIAGNOSIS — E440 Moderate protein-calorie malnutrition: Secondary | ICD-10-CM | POA: Diagnosis present

## 2017-03-20 DIAGNOSIS — R531 Weakness: Secondary | ICD-10-CM | POA: Diagnosis not present

## 2017-03-20 DIAGNOSIS — Z9981 Dependence on supplemental oxygen: Secondary | ICD-10-CM | POA: Diagnosis not present

## 2017-03-20 DIAGNOSIS — R262 Difficulty in walking, not elsewhere classified: Secondary | ICD-10-CM | POA: Diagnosis not present

## 2017-03-20 DIAGNOSIS — N170 Acute kidney failure with tubular necrosis: Secondary | ICD-10-CM | POA: Diagnosis not present

## 2017-03-20 DIAGNOSIS — J9601 Acute respiratory failure with hypoxia: Secondary | ICD-10-CM | POA: Diagnosis present

## 2017-03-20 DIAGNOSIS — I4891 Unspecified atrial fibrillation: Secondary | ICD-10-CM | POA: Diagnosis present

## 2017-03-20 DIAGNOSIS — Z7952 Long term (current) use of systemic steroids: Secondary | ICD-10-CM | POA: Diagnosis not present

## 2017-03-20 DIAGNOSIS — R404 Transient alteration of awareness: Secondary | ICD-10-CM | POA: Diagnosis not present

## 2017-03-20 DIAGNOSIS — I959 Hypotension, unspecified: Secondary | ICD-10-CM | POA: Diagnosis present

## 2017-03-20 DIAGNOSIS — R0602 Shortness of breath: Secondary | ICD-10-CM | POA: Diagnosis not present

## 2017-03-20 DIAGNOSIS — J449 Chronic obstructive pulmonary disease, unspecified: Secondary | ICD-10-CM | POA: Diagnosis present

## 2017-03-20 DIAGNOSIS — R1319 Other dysphagia: Secondary | ICD-10-CM | POA: Diagnosis not present

## 2017-03-20 DIAGNOSIS — J440 Chronic obstructive pulmonary disease with acute lower respiratory infection: Secondary | ICD-10-CM | POA: Diagnosis not present

## 2017-03-20 DIAGNOSIS — K8020 Calculus of gallbladder without cholecystitis without obstruction: Secondary | ICD-10-CM | POA: Diagnosis not present

## 2017-03-20 DIAGNOSIS — R279 Unspecified lack of coordination: Secondary | ICD-10-CM | POA: Diagnosis not present

## 2017-03-20 DIAGNOSIS — J9611 Chronic respiratory failure with hypoxia: Secondary | ICD-10-CM | POA: Diagnosis not present

## 2017-03-20 DIAGNOSIS — M1A00X0 Idiopathic chronic gout, unspecified site, without tophus (tophi): Secondary | ICD-10-CM | POA: Diagnosis not present

## 2017-03-20 DIAGNOSIS — M109 Gout, unspecified: Secondary | ICD-10-CM | POA: Diagnosis present

## 2017-03-20 DIAGNOSIS — F17210 Nicotine dependence, cigarettes, uncomplicated: Secondary | ICD-10-CM | POA: Diagnosis present

## 2017-03-20 DIAGNOSIS — R69 Illness, unspecified: Secondary | ICD-10-CM | POA: Diagnosis not present

## 2017-03-20 HISTORY — DX: Personal history of other diseases of the digestive system: Z87.19

## 2017-03-20 HISTORY — DX: Chronic obstructive pulmonary disease, unspecified: J44.9

## 2017-03-20 LAB — COMPREHENSIVE METABOLIC PANEL
ALBUMIN: 3.5 g/dL (ref 3.5–5.0)
ALK PHOS: 60 U/L (ref 38–126)
ALT: 17 U/L (ref 17–63)
ANION GAP: 13 (ref 5–15)
AST: 27 U/L (ref 15–41)
BILIRUBIN TOTAL: 3.3 mg/dL — AB (ref 0.3–1.2)
BUN: 27 mg/dL — ABNORMAL HIGH (ref 6–20)
CALCIUM: 8.8 mg/dL — AB (ref 8.9–10.3)
CO2: 24 mmol/L (ref 22–32)
Chloride: 102 mmol/L (ref 101–111)
Creatinine, Ser: 0.81 mg/dL (ref 0.61–1.24)
GFR calc Af Amer: >60 mL/min (ref >60)
GLUCOSE: 94 mg/dL (ref 65–99)
POTASSIUM: 3.5 mmol/L (ref 3.5–5.1)
Sodium: 139 mmol/L (ref 135–145)
TOTAL PROTEIN: 6.7 g/dL (ref 6.5–8.1)

## 2017-03-20 LAB — URINALYSIS, ROUTINE W REFLEX MICROSCOPIC
GLUCOSE, UA: NEGATIVE mg/dL
Hgb urine dipstick: NEGATIVE
Ketones, ur: NEGATIVE mg/dL
Leukocytes, UA: NEGATIVE
NITRITE: NEGATIVE
PH: 5 (ref 5.0–8.0)
PROTEIN: 100 mg/dL — AB
SPECIFIC GRAVITY, URINE: 1.025 (ref 1.005–1.030)

## 2017-03-20 LAB — CBC WITH DIFFERENTIAL/PLATELET
BASOS PCT: 0 %
Basophils Absolute: 0 10*3/uL (ref 0.0–0.1)
EOS ABS: 0.1 10*3/uL (ref 0.0–0.7)
EOS PCT: 0 %
HCT: 50 % (ref 39.0–52.0)
Hemoglobin: 16 g/dL (ref 13.0–17.0)
Lymphocytes Relative: 6 %
Lymphs Abs: 0.7 10*3/uL (ref 0.7–4.0)
MCH: 31.4 pg (ref 26.0–34.0)
MCHC: 32 g/dL (ref 30.0–36.0)
MCV: 98 fL (ref 78.0–100.0)
MONOS PCT: 11 %
Monocytes Absolute: 1.2 10*3/uL — ABNORMAL HIGH (ref 0.1–1.0)
Neutro Abs: 9.3 10*3/uL — ABNORMAL HIGH (ref 1.7–7.7)
Neutrophils Relative %: 83 %
PLATELETS: 163 10*3/uL (ref 150–400)
RBC: 5.1 MIL/uL (ref 4.22–5.81)
RDW: 16.8 % — ABNORMAL HIGH (ref 11.5–15.5)
WBC: 11.2 10*3/uL — AB (ref 4.0–10.5)

## 2017-03-20 LAB — I-STAT CG4 LACTIC ACID, ED: LACTIC ACID, VENOUS: 2.09 mmol/L — AB (ref 0.5–1.9)

## 2017-03-20 LAB — PROTIME-INR
INR: 1.39
PROTHROMBIN TIME: 17 s — AB (ref 11.4–15.2)

## 2017-03-20 LAB — LIPASE, BLOOD: Lipase: 25 U/L (ref 11–51)

## 2017-03-20 MED ORDER — ALBUTEROL SULFATE (2.5 MG/3ML) 0.083% IN NEBU
2.5000 mg | INHALATION_SOLUTION | Freq: Once | RESPIRATORY_TRACT | Status: AC
  Administered 2017-03-20: 2.5 mg via RESPIRATORY_TRACT
  Filled 2017-03-20: qty 3

## 2017-03-20 MED ORDER — AZITHROMYCIN 500 MG IV SOLR
500.0000 mg | Freq: Once | INTRAVENOUS | Status: AC
  Administered 2017-03-20: 500 mg via INTRAVENOUS
  Filled 2017-03-20: qty 500

## 2017-03-20 MED ORDER — APIXABAN 5 MG PO TABS
5.0000 mg | ORAL_TABLET | Freq: Two times a day (BID) | ORAL | Status: DC
  Administered 2017-03-21 – 2017-04-02 (×26): 5 mg via ORAL
  Filled 2017-03-20 (×26): qty 1

## 2017-03-20 MED ORDER — FUROSEMIDE 40 MG PO TABS
40.0000 mg | ORAL_TABLET | Freq: Every day | ORAL | Status: DC
  Administered 2017-03-21: 40 mg via ORAL
  Filled 2017-03-20: qty 1

## 2017-03-20 MED ORDER — COLCHICINE 0.6 MG PO TABS
0.6000 mg | ORAL_TABLET | Freq: Two times a day (BID) | ORAL | Status: DC
  Administered 2017-03-21 (×3): 0.6 mg via ORAL
  Filled 2017-03-20 (×3): qty 1

## 2017-03-20 MED ORDER — ALBUTEROL (5 MG/ML) CONTINUOUS INHALATION SOLN
2.5000 mg/h | INHALATION_SOLUTION | RESPIRATORY_TRACT | Status: DC
  Administered 2017-03-20: 20 mg/h via RESPIRATORY_TRACT
  Filled 2017-03-20: qty 20

## 2017-03-20 MED ORDER — DEXTROSE 5 % IV SOLN
1.0000 g | Freq: Once | INTRAVENOUS | Status: AC
  Administered 2017-03-20: 1 g via INTRAVENOUS
  Filled 2017-03-20: qty 10

## 2017-03-20 MED ORDER — IPRATROPIUM-ALBUTEROL 0.5-2.5 (3) MG/3ML IN SOLN: 3.0000 mL | RESPIRATORY_TRACT | Status: DC | PRN

## 2017-03-20 MED ORDER — METOPROLOL TARTRATE 50 MG PO TABS
50.0000 mg | ORAL_TABLET | Freq: Two times a day (BID) | ORAL | Status: DC
  Administered 2017-03-21 (×3): 50 mg via ORAL
  Filled 2017-03-20 (×3): qty 1

## 2017-03-20 MED ORDER — SODIUM CHLORIDE 0.9 % IV SOLN: 250.0000 mL | INTRAVENOUS | Status: DC | PRN

## 2017-03-20 MED ORDER — HYDROCODONE-ACETAMINOPHEN 10-325 MG PO TABS
1.0000 | ORAL_TABLET | Freq: Four times a day (QID) | ORAL | Status: DC
  Administered 2017-03-21 – 2017-03-26 (×14): 1 via ORAL
  Filled 2017-03-20 (×15): qty 1

## 2017-03-20 MED ORDER — METHYLPREDNISOLONE SODIUM SUCC 125 MG IJ SOLR
125.0000 mg | Freq: Once | INTRAMUSCULAR | Status: AC
  Administered 2017-03-20: 125 mg via INTRAVENOUS
  Filled 2017-03-20: qty 2

## 2017-03-20 MED ORDER — METHYLPREDNISOLONE SODIUM SUCC 125 MG IJ SOLR
80.0000 mg | Freq: Two times a day (BID) | INTRAMUSCULAR | Status: DC
  Administered 2017-03-21 (×2): 80 mg via INTRAVENOUS
  Filled 2017-03-20 (×2): qty 2

## 2017-03-20 MED ORDER — ALBUTEROL SULFATE (2.5 MG/3ML) 0.083% IN NEBU
INHALATION_SOLUTION | RESPIRATORY_TRACT | Status: AC
  Administered 2017-03-20: 2.5 mg via RESPIRATORY_TRACT
  Filled 2017-03-20: qty 24

## 2017-03-20 MED ORDER — DEXTROSE 5 % IV SOLN
1.0000 g | INTRAVENOUS | Status: DC
  Administered 2017-03-21 – 2017-03-24 (×4): 1 g via INTRAVENOUS
  Filled 2017-03-20 (×6): qty 10

## 2017-03-20 MED ORDER — FUROSEMIDE 10 MG/ML IJ SOLN
80.0000 mg | Freq: Once | INTRAMUSCULAR | Status: AC
  Administered 2017-03-20: 80 mg via INTRAVENOUS
  Filled 2017-03-20: qty 8

## 2017-03-20 MED ORDER — ALBUTEROL SULFATE (2.5 MG/3ML) 0.083% IN NEBU
INHALATION_SOLUTION | RESPIRATORY_TRACT | Status: AC
  Administered 2017-03-20: 2.5 mg
  Filled 2017-03-20: qty 3

## 2017-03-20 MED ORDER — AZITHROMYCIN 500 MG IV SOLR
500.0000 mg | INTRAVENOUS | Status: DC
  Administered 2017-03-21 – 2017-03-24 (×4): 500 mg via INTRAVENOUS
  Filled 2017-03-20 (×7): qty 500

## 2017-03-20 MED ORDER — SODIUM CHLORIDE 0.9% FLUSH: 3.0000 mL | INTRAVENOUS | Status: DC | PRN

## 2017-03-20 MED ORDER — SODIUM CHLORIDE 0.9 % IV SOLN
INTRAVENOUS | Status: DC
  Administered 2017-03-20: 20:00:00 via INTRAVENOUS

## 2017-03-20 MED ORDER — SODIUM CHLORIDE 0.9% FLUSH
3.0000 mL | Freq: Two times a day (BID) | INTRAVENOUS | Status: DC
  Administered 2017-03-21 – 2017-04-02 (×24): 3 mL via INTRAVENOUS

## 2017-03-20 NOTE — ED Provider Notes (Signed)
[facility] EMERGENCY DEPARTMENT Provider Note   CSN: [id number] Arrival date & time: 03/20/17  1815     History   Chief Complaint Chief Complaint  Patient presents with  . Weakness    HPI Brett Carey is a 77 y.o. male.  Patient brought in by wife.  She called EMS because of generalized weakness some confusion decreased appetite significantly worsening shortness of breath with exertion.  EMS noted wheezing.  Patient has a history of COPD.  Patient given nebulizer treatment upon arrival here with improvement but have been using inhalers at home without any help.  Patient was initially on a nonrebreather.  Patient is now on 3 L with a sat of around 95%.  This is after his initial breathing treatment.  Patient's wife states that patient does not like to come to the hospital.  She been trying to get him here for a while but he has a generalized fear.  Patient is on Lasix and Lopressor patient has a and is also on Eliquis for atrial fib.  He has a history of atrial fib is mention congestive heart failure hypertension and COPD.  He does have oxygen available at home.      Past Medical History:  Diagnosis Date  . Atrial fibrillation with RVR ([place]) 01/04/2014  . CHF (congestive heart failure) ([place])   . Gout   . Hypertension   . Pneumonia X 2    Patient Active Problem List   Diagnosis Date Noted  . Atrial fibrillation ([place]) 01/04/2014  . Syncope 01/04/2014  . Facial hematoma 01/04/2014  . Benign essential HTN 01/04/2014    Past Surgical History:  Procedure Laterality Date  . BACK SURGERY    . CATARACT EXTRACTION W/ INTRAOCULAR LENS  IMPLANT, BILATERAL Bilateral 2000's  . LUMBAR DISC SURGERY         Home Medications    Prior to Admission medications   Medication Sig Start Date End Date Taking? Authorizing Provider  apixaban (ELIQUIS) 5 MG TABS tablet Take 1 tablet (5 mg total) by mouth 2 (two) times daily. 01/08/14   Donne Hazel, MD  colchicine (COLCRYS) 0.6 MG  tablet Take 1 tablet (0.6 mg total) by mouth 2 (two) times daily. 01/08/14   Donne Hazel, MD  furosemide (LASIX) 40 MG tablet Take 1 tablet (40 mg total) by mouth daily. 01/08/14   Donne Hazel, MD  HYDROcodone-acetaminophen [facility]) 10-325 MG per tablet Take 1 tablet by mouth every 6 (six) hours. 01/03/14   [provider]  metoprolol (LOPRESSOR) 50 MG tablet Take 50 mg by mouth 2 (two) times daily.    [provider]  predniSONE (DELTASONE) 20 MG tablet Take 3 tablets (60 mg total) by mouth daily with breakfast. 01/08/14   Donne Hazel, MD  PROAIR HFA 108 (90 BASE) MCG/ACT inhaler Take 2 puffs by mouth daily as needed. 11/08/13   [provider]    Family History No family history on file.  Social History Social History   Tobacco Use  . Smoking status: Current Every Day Smoker    Packs/day: 0.33    Years: 57.00    Pack years: 18.81    Types: Cigarettes  . Smokeless tobacco: Former Systems developer    Types: Chew  . Tobacco comment: "never chewed much"  Substance Use Topics  . Alcohol use: Yes    Comment: "drank some; stopped in the 1970's"  . Drug use: No     Allergies   Patient has no  known allergies.   Review of Systems Review of Systems  Constitutional: Positive for fatigue. Negative for fever.  HENT: Negative for congestion.   Eyes: Negative for redness.  Respiratory: Positive for shortness of breath and wheezing.   Cardiovascular: Positive for leg swelling. Negative for chest pain.  Gastrointestinal: Positive for abdominal distention. Negative for abdominal pain, diarrhea, nausea and vomiting.  Genitourinary: Negative for dysuria.  Musculoskeletal: Negative for back pain.  Skin: Negative for rash.  Neurological: Positive for weakness. Negative for syncope and headaches.  Hematological: Bruises/bleeds easily.  Psychiatric/Behavioral: Positive for confusion.     Physical Exam Updated Vital Signs BP (!) 123/102   Pulse 88   Temp 98.7 F  (37.1 C) (Oral)   Resp (!) 25   Ht 1.626 m (5\' 4" )   Wt 101.2 kg (223 lb)   SpO2 97%   BMI 38.28 kg/m   Physical Exam  Constitutional: He appears well-developed and well-nourished. He appears distressed.  HENT:  Head: Normocephalic and atraumatic.  Mucous membranes dry  Eyes: Conjunctivae and EOM are normal. Pupils are equal, round, and reactive to light.  Neck: Neck supple.  Cardiovascular: Normal rate, regular rhythm and normal heart sounds.  Pulmonary/Chest: Breath sounds normal. He is in respiratory distress. He has no wheezes.  Abdominal: Soft. Bowel sounds are normal. He exhibits distension. There is no tenderness.  Musculoskeletal: Normal range of motion. He exhibits edema.  Neurological: He is alert. No cranial nerve deficit or sensory deficit. He exhibits normal muscle tone. Coordination normal.  Skin: Skin is warm.  Nursing note and vitals reviewed.    ED Treatments / Results  Labs (all labs ordered are listed, but only abnormal results are displayed) Labs Reviewed  COMPREHENSIVE METABOLIC PANEL - Abnormal; Notable for the following components:      Result Value   BUN 27 (*)    Calcium 8.8 (*)    Total Bilirubin 3.3 (*)    All other components within normal limits  CBC WITH DIFFERENTIAL/PLATELET - Abnormal; Notable for the following components:   WBC 11.2 (*)    RDW 16.8 (*)    Neutro Abs 9.3 (*)    Monocytes Absolute 1.2 (*)    All other components within normal limits  PROTIME-INR - Abnormal; Notable for the following components:   Prothrombin Time 17.0 (*)    All other components within normal limits  URINALYSIS, ROUTINE W REFLEX MICROSCOPIC - Abnormal; Notable for the following components:   Color, Urine AMBER (*)    APPearance HAZY (*)    Bilirubin Urine SMALL (*)    Protein, ur 100 (*)    Bacteria, UA RARE (*)    Squamous Epithelial / LPF 0-5 (*)    All other components within normal limits  I-STAT CG4 LACTIC ACID, ED - Abnormal; Notable for the  following components:   Lactic Acid, Venous 2.09 (*)    All other components within normal limits  CULTURE, BLOOD (ROUTINE X 2)  CULTURE, BLOOD (ROUTINE X 2)  LIPASE, BLOOD  I-STAT CG4 LACTIC ACID, ED    EKG  EKG Interpretation  Date/Time:  Thursday March 20 2017 18:31:55 EST Ventricular Rate:  115 PR Interval:    QRS Duration: 139 QT Interval:  376 QTC Calculation: 504 R Axis:   128 Text Interpretation:  Atrial fibrillation Paired ventricular premature complexes Right bundle branch block Lateral infarct, old Confirmed by Fredia Sorrow [phone]) on 03/20/2017 6:56:29 PM       Radiology Dg Chest 2 View  Result Date: 03/20/2017 CLINICAL DATA:  Generalize weakness with decreased appetite and short of breath EXAM: CHEST  2 VIEW COMPARISON:  01/04/2014 FINDINGS: Suspected moderate right pleural effusion. Cardiomegaly with vascular congestion and mild pulmonary edema. Aortic atherosclerosis. No pneumothorax. Mild degenerative changes of the spine. Airspace disease at the right middle lobe and lung base. IMPRESSION: 1. Cardiomegaly with vascular congestion and mild pulmonary edema. Suspected moderate right pleural effusion 2. Airspace disease at the right middle lobe and lung base could reflect superimposed pneumonia Electronically Signed   By: Donavan Foil M.D.   On: 03/20/2017 19:07    Procedures Procedures (including critical care time)  Medications Ordered in ED Medications  azithromycin (ZITHROMAX) 500 mg in dextrose 5 % 250 mL IVPB (not administered)  0.9 %  sodium chloride infusion ( Intravenous New Bag/Given 03/20/17 2014)  furosemide (LASIX) injection 80 mg (not administered)  cefTRIAXone (ROCEPHIN) 1 g in dextrose 5 % 50 mL IVPB (0 g Intravenous Stopped 03/20/17 2027)     Initial Impression / Assessment and Plan / ED Course  I have reviewed the triage vital signs and the nursing notes.  Pertinent labs & imaging results that were available during my care of the patient  were reviewed by me and considered in my medical decision making (see chart for details).    Patient in some respiratory distress.  Some tachypnea decreased air movement.  After his breathing treatment better air movement no further wheezing.  Chest x-ray showed evidence of some pulmonary edema as well as a right middle lobe pneumonia.  Patient will require admission.  EKG shows rate controlled atrial fib on 3 L of oxygen oxygen sats are 95-96%.  Patient also received 80 of Lasix for the pulmonary edema.  And started on community-acquired pneumonia antibiotics Rocephin and Zithromax.  Patient's lactic acid slightly above normal at 2 no hypotension no fevers no significant tachycardia.  Blood cultures were sent and are pending patient currently not fitting code sepsis criteria.  Discussed with hospitalist they will admit.  In addition in discussion with his wife there is probably a component of some dementia going on at home as well.   Final Clinical Impressions(s) / ED Diagnoses   Final diagnoses:  Community acquired pneumonia of right middle lobe of lung ([place])  Congestive heart failure, unspecified HF chronicity, unspecified heart failure type [facility])    ED Discharge Orders    None       Fredia Sorrow, MD 03/20/17 2114

## 2017-03-20 NOTE — H&P (Signed)
History and Physical    Brett Carey UJW:[id number] DOB: 09-28-1940 DOA: 03/20/2017  PCP: Celene Squibb, MD  Patient coming from: Home  Chief Complaint: Shortness of breath  HPI: Brett Carey is a 77 y.o. male with medical history significant of COPD, CHF, A. fib on Eliquis, hypertension comes in with several days of worsening shortness of breath and cough.  Patient takes care of his wife who has dementia at home and was very reluctant and refusing to come to the hospital according to his daughter because of his concerns with his wife being at home alone.  His daughter arranged for mom to be taking care of and finally convinced him to come to the ED.  Daughter reports that he has been very short of breath since yesterday.  He has been coughing a lot.  Patient denies any fevers or chills.  He has been wheezing at home.  He is a smoker and he does have oxygen as needed at home.  He has not had any nausea vomiting or diarrhea.  He has not been recently hospitalized or on recent antibiotics.  Patient found to have pneumonia in the ED and referred for admission for pneumonia.  Per emergency room physician report patient was stable for MedSurg floor and was in no respiratory distress earlier.  On arrival to [facility] floor however he is in moderate respiratory distress with very diminished breath sounds.  He actually says that he is not short of breath when obviously he is.  Review of Systems: As per HPI otherwise 10 point review of systems negative.   Past Medical History:  Diagnosis Date  . Atrial fibrillation with RVR ([place]) 01/04/2014  . CHF (congestive heart failure) ([place])   . COPD (chronic obstructive pulmonary disease) ([place])   . Gout   . History of hiatal hernia   . Hypertension   . Pneumonia X 2    Past Surgical History:  Procedure Laterality Date  . BACK SURGERY    . CATARACT EXTRACTION W/ INTRAOCULAR LENS  IMPLANT, BILATERAL Bilateral 2000's  . LUMBAR DISC SURGERY       reports that  he has been smoking cigarettes.  He has a 18.81 pack-year smoking history. He has quit using smokeless tobacco. His smokeless tobacco use included chew. He reports that he drinks alcohol. He reports that he does not use drugs.  No Known Allergies  History reviewed. No pertinent family history.  No premature coronary artery disease  Prior to Admission medications   Medication Sig Start Date End Date Taking? Authorizing Provider  metoprolol (LOPRESSOR) 50 MG tablet Take 50 mg by mouth 2 (two) times daily.   Yes [provider]  apixaban (ELIQUIS) 5 MG TABS tablet Take 1 tablet (5 mg total) by mouth 2 (two) times daily. 01/08/14   Donne Hazel, MD  colchicine (COLCRYS) 0.6 MG tablet Take 1 tablet (0.6 mg total) by mouth 2 (two) times daily. 01/08/14   Donne Hazel, MD  furosemide (LASIX) 40 MG tablet Take 1 tablet (40 mg total) by mouth daily. 01/08/14   Donne Hazel, MD  HYDROcodone-acetaminophen [facility]) 10-325 MG per tablet Take 1 tablet by mouth every 6 (six) hours. 01/03/14   [provider]  predniSONE (DELTASONE) 20 MG tablet Take 3 tablets (60 mg total) by mouth daily with breakfast. 01/08/14   Donne Hazel, MD  PROAIR HFA 108 (90 BASE) MCG/ACT inhaler Take 2 puffs by mouth daily as needed. 11/08/13   [provider]  Physical Exam: Vitals:   03/20/17 2115 03/20/17 2132 03/20/17 2237 03/20/17 2301  BP: (!) 116/93  (!) 115/92   Pulse: 92  98   Resp:   (!) 22   Temp:  98.1 F (36.7 C) 98.4 F (36.9 C)   TempSrc:  Oral Oral   SpO2: 96%  95% 94%  Weight:   103.2 kg (227 lb 8.2 oz)   Height:          Constitutional: Moderate respiratory distress Vitals:   03/20/17 2115 03/20/17 2132 03/20/17 2237 03/20/17 2301  BP: (!) 116/93  (!) 115/92   Pulse: 92  98   Resp:   (!) 22   Temp:  98.1 F (36.7 C) 98.4 F (36.9 C)   TempSrc:  Oral Oral   SpO2: 96%  95% 94%  Weight:   103.2 kg (227 lb 8.2 oz)   Height:       Eyes: PERRL, lids and  conjunctivae normal ENMT: Mucous membranes are moist. Posterior pharynx clear of any exudate or lesions.Normal dentition.  Neck: normal, supple, no masses, no thyromegaly Respiratory: Very diminished auscultation bilaterally, bilateral wheezing, no crackles.  Increased respiratory effort. No accessory muscle use.  Cardiovascular: Regular rate and rhythm, no murmurs / rubs / gallops. No extremity edema. 2+ pedal pulses. No carotid bruits.  Abdomen: no tenderness, no masses palpated. No hepatosplenomegaly. Bowel sounds positive.  Musculoskeletal: no clubbing / cyanosis. No joint deformity upper and lower extremities. Good ROM, no contractures. Normal muscle tone.  Skin: no rashes, lesions, ulcers. No induration Neurologic: CN 2-12 grossly intact. Sensation intact, DTR normal. Strength 5/5 in all 4.  Psychiatric: Normal judgment and insight. Alert and oriented x 3. Normal mood.    Labs on Admission: I have personally reviewed following labs and imaging studies  CBC: Recent Labs  Lab 03/20/17 1908  WBC 11.2*  NEUTROABS 9.3*  HGB 16.0  HCT 50.0  MCV 98.0  PLT 527   Basic Metabolic Panel: Recent Labs  Lab 03/20/17 1908  NA 139  K 3.5  CL 102  CO2 24  GLUCOSE 94  BUN 27*  CREATININE 0.81  CALCIUM 8.8*   GFR: Estimated Creatinine Clearance: 85.6 mL/min (by C-G formula based on SCr of 0.81 mg/dL). Liver Function Tests: Recent Labs  Lab 03/20/17 1908  AST 27  ALT 17  ALKPHOS 60  BILITOT 3.3*  PROT 6.7  ALBUMIN 3.5   Recent Labs  Lab 03/20/17 1909  LIPASE 25   No results for input(s): AMMONIA in the last 168 hours. Coagulation Profile: Recent Labs  Lab 03/20/17 1908  INR 1.39   Cardiac Enzymes: No results for input(s): CKTOTAL, CKMB, CKMBINDEX, TROPONINI in the last 168 hours. BNP (last 3 results) No results for input(s): PROBNP in the last 8760 hours. HbA1C: No results for input(s): HGBA1C in the last 72 hours. CBG: No results for input(s): GLUCAP in the  last 168 hours. Lipid Profile: No results for input(s): CHOL, HDL, LDLCALC, TRIG, CHOLHDL, LDLDIRECT in the last 72 hours. Thyroid Function Tests: No results for input(s): TSH, T4TOTAL, FREET4, T3FREE, THYROIDAB in the last 72 hours. Anemia Panel: No results for input(s): VITAMINB12, FOLATE, FERRITIN, TIBC, IRON, RETICCTPCT in the last 72 hours. Urine analysis:    Component Value Date/Time   COLORURINE AMBER (A) 03/20/2017 1900   APPEARANCEUR HAZY (A) 03/20/2017 1900   LABSPEC 1.025 03/20/2017 1900   PHURINE 5.0 03/20/2017 1900   GLUCOSEU NEGATIVE 03/20/2017 1900   HGBUR NEGATIVE 03/20/2017 1900  BILIRUBINUR SMALL (A) 03/20/2017 1900   KETONESUR NEGATIVE 03/20/2017 1900   PROTEINUR 100 (A) 03/20/2017 1900   NITRITE NEGATIVE 03/20/2017 1900   LEUKOCYTESUR NEGATIVE 03/20/2017 1900   Sepsis Labs: !!!!!!!!!!!!!!!!!!!!!!!!!!!!!!!!!!!!!!!!!!!! @LABRCNTIP (procalcitonin:4,lacticidven:4) ) Recent Results (from the past 240 hour(s))  Culture, blood (Routine x 2)     Status: None (Preliminary result)   Collection Time: 03/20/17  7:09 PM  Result Value Ref Range Status   Specimen Description RIGHT ANTECUBITAL  Final   Special Requests   Final    BOTTLES DRAWN AEROBIC ONLY Blood Culture adequate volume   Culture PENDING  Incomplete   Report Status PENDING  Incomplete  Culture, blood (Routine x 2)     Status: None (Preliminary result)   Collection Time: 03/20/17  7:09 PM  Result Value Ref Range Status   Specimen Description BLOOD LEFT HAND  Final   Special Requests   Final    BOTTLES DRAWN AEROBIC ONLY Blood Culture adequate volume   Culture PENDING  Incomplete   Report Status PENDING  Incomplete     Radiological Exams on Admission: Dg Chest 2 View  Result Date: 03/20/2017 CLINICAL DATA:  Generalize weakness with decreased appetite and short of breath EXAM: CHEST  2 VIEW COMPARISON:  01/04/2014 FINDINGS: Suspected moderate right pleural effusion. Cardiomegaly with vascular congestion  and mild pulmonary edema. Aortic atherosclerosis. No pneumothorax. Mild degenerative changes of the spine. Airspace disease at the right middle lobe and lung base. IMPRESSION: 1. Cardiomegaly with vascular congestion and mild pulmonary edema. Suspected moderate right pleural effusion 2. Airspace disease at the right middle lobe and lung base could reflect superimposed pneumonia Electronically Signed   By: Donavan Foil M.D.   On: 03/20/2017 19:07    EKG: Independently reviewed.  A. fib with right bundle branch block  Assessment/Plan 77 year old male with acute respiratory distress secondary to pneumonia and COPD  Principal Problem:   PNA (pneumonia)-IV Rocephin and azithromycin.  Obtain blood and sputum cultures.  Pneumonia pathway.  Treat COPD as below.  Active Problems:   Acute respiratory failure with hypoxia (HCC)-stat Solu-Medrol 125 mg IV now.  Placed on 80 mg every 12 hours thereafter.  Stat albuterol neb treatment ordered now.  If patient does not improve with hour-long neb treatment may need to go to stepdown.  In the emergency department he responded very well to nebulizer treatment.  Frequent bronchodilators ordered.  Treat infection.   Atrial fibrillation (HCC)-telemetry monitoring continue Eliquis   Benign essential HTN-stable    COPD (chronic obstructive pulmonary disease) (HCC)-IV Solu-Medrol now.  Frequent nebulizers.  Stat albuterol neb now.   CHF (congestive heart failure) (HCC)-continue Lasix    I had a full discussion about his CODE STATUS with him and his daughter.  He wishes do not intubation and would only like a shock once.   DVT prophylaxis: Eliquis Code Status: Limited code no CPR no intubation shock only once Family Communication: Daughter Disposition Plan: Per day team Consults called: None Admission status: Admission   Delma Villalva A MD [facility]  If 7PM-7AM, please contact night-coverage [email or web address] Password [facility]  03/20/2017, 11:09 PM

## 2017-03-20 NOTE — ED Triage Notes (Signed)
Ems reports pt's wife called ems because pt c/o generalized weakness, ams, decreased appetite, and sob with exertion.   EMS reports significant wheezing.  Pt has used inhaler today but didn't help.    02 sat on NRB was 92% and decreased to 89% on [place].  O2 sat 93% on 2 liters after breathing treatment.

## 2017-03-21 ENCOUNTER — Other Ambulatory Visit: Payer: Self-pay

## 2017-03-21 DIAGNOSIS — J441 Chronic obstructive pulmonary disease with (acute) exacerbation: Secondary | ICD-10-CM

## 2017-03-21 DIAGNOSIS — E440 Moderate protein-calorie malnutrition: Secondary | ICD-10-CM

## 2017-03-21 LAB — BASIC METABOLIC PANEL
Anion gap: 16 — ABNORMAL HIGH (ref 5–15)
BUN: 29 mg/dL — AB (ref 6–20)
CALCIUM: 9 mg/dL (ref 8.9–10.3)
CO2: 22 mmol/L (ref 22–32)
CREATININE: 0.92 mg/dL (ref 0.61–1.24)
Chloride: 102 mmol/L (ref 101–111)
GFR calc Af Amer: >60 mL/min (ref >60)
GLUCOSE: 144 mg/dL — AB (ref 65–99)
Potassium: 3.7 mmol/L (ref 3.5–5.1)
SODIUM: 140 mmol/L (ref 135–145)

## 2017-03-21 LAB — LACTIC ACID, PLASMA
LACTIC ACID, VENOUS: 2.2 mmol/L — AB (ref 0.5–1.9)
Lactic Acid, Venous: 2.7 mmol/L (ref 0.5–1.9)

## 2017-03-21 LAB — CBC WITH DIFFERENTIAL/PLATELET
BASOS ABS: 0 10*3/uL (ref 0.0–0.1)
BASOS PCT: 0 %
EOS ABS: 0 10*3/uL (ref 0.0–0.7)
EOS PCT: 0 %
HCT: 50.8 % (ref 39.0–52.0)
Hemoglobin: 16.5 g/dL (ref 13.0–17.0)
LYMPHS PCT: 4 %
Lymphs Abs: 0.5 10*3/uL — ABNORMAL LOW (ref 0.7–4.0)
MCH: 31.5 pg (ref 26.0–34.0)
MCHC: 32.5 g/dL (ref 30.0–36.0)
MCV: 97.1 fL (ref 78.0–100.0)
Monocytes Absolute: 0.7 10*3/uL (ref 0.1–1.0)
Monocytes Relative: 6 %
Neutro Abs: 10.4 10*3/uL — ABNORMAL HIGH (ref 1.7–7.7)
Neutrophils Relative %: 90 %
PLATELETS: 145 10*3/uL — AB (ref 150–400)
RBC: 5.23 MIL/uL (ref 4.22–5.81)
RDW: 16.7 % — ABNORMAL HIGH (ref 11.5–15.5)
WBC: 11.6 10*3/uL — AB (ref 4.0–10.5)

## 2017-03-21 LAB — STREP PNEUMONIAE URINARY ANTIGEN: STREP PNEUMO URINARY ANTIGEN: NEGATIVE

## 2017-03-21 LAB — MRSA PCR SCREENING: MRSA BY PCR: NEGATIVE

## 2017-03-21 MED ORDER — ORAL CARE MOUTH RINSE
15.0000 mL | Freq: Two times a day (BID) | OROMUCOSAL | Status: DC
  Administered 2017-03-21 – 2017-04-02 (×20): 15 mL via OROMUCOSAL

## 2017-03-21 MED ORDER — IPRATROPIUM-ALBUTEROL 0.5-2.5 (3) MG/3ML IN SOLN
3.0000 mL | RESPIRATORY_TRACT | Status: DC
  Administered 2017-03-21 – 2017-03-23 (×9): 3 mL via RESPIRATORY_TRACT
  Filled 2017-03-21 (×9): qty 3

## 2017-03-21 MED ORDER — ALBUTEROL SULFATE (2.5 MG/3ML) 0.083% IN NEBU
2.5000 mg | INHALATION_SOLUTION | RESPIRATORY_TRACT | Status: DC | PRN
  Administered 2017-03-21: 2.5 mg via RESPIRATORY_TRACT
  Filled 2017-03-21: qty 3

## 2017-03-21 MED ORDER — GUAIFENESIN ER 600 MG PO TB12
1200.0000 mg | ORAL_TABLET | Freq: Two times a day (BID) | ORAL | Status: DC
  Administered 2017-03-21 – 2017-04-02 (×25): 1200 mg via ORAL
  Filled 2017-03-21 (×25): qty 2

## 2017-03-21 MED ORDER — LORAZEPAM 2 MG/ML IJ SOLN: 0.5000 mg | Freq: Three times a day (TID) | INTRAMUSCULAR | Status: DC | PRN

## 2017-03-21 NOTE — Plan of Care (Signed)
Notified Dr. Myna Hidalgo patient had a critical lactic acid of 2.2. See updated orders.

## 2017-03-21 NOTE — Progress Notes (Signed)
PROGRESS NOTE    Brett Carey  CHE:[id number] DOB: 08/30/1940 DOA: 03/20/2017 PCP: Celene Squibb, MD    Brief Narrative:  77 year old male with a history of COPD, atrial fibrillation, presented to the hospital with progressive shortness of breath.  Found to have COPD exacerbation as well as community-acquired pneumonia.  He had respiratory failure requiring BiPAP therapy, now on nasal cannula.  Currently on IV steroids, antibiotics and bronchodilators.   Assessment & Plan:   Principal Problem:   PNA (pneumonia) Active Problems:   Atrial fibrillation (HCC)   Benign essential HTN   Acute respiratory failure with hypoxia (HCC)   Community acquired pneumonia of right middle lobe of lung (HCC)   COPD (chronic obstructive pulmonary disease) (HCC)   CHF (congestive heart failure) (HCC)   Malnutrition of moderate degree   1. Acute respiratory failure with hypoxia.  Related to pneumonia and COPD exacerbation.  Briefly required BiPAP, now on nasal cannula.  Continue to wean off oxygen as tolerated. 2. Community-acquired pneumonia.  Currently on Rocephin and azithromycin.  Continue pulmonary hygiene. 3. COPD exacerbation.  Wheezing has improved with treatment.  Continue on IV steroids, bronchodilators for now. 4. Chronic diastolic congestive heart failure.  Patient does have some lower extremity edema, but this is reported to be chronic and better than baseline at this time.  Continue on oral Lasix. 5. Atrial fibrillation.  Currently rate controlled.  Continue on beta-blockers.  Anticoagulated with apixaban.   DVT prophylaxis: Apixaban Code Status: Partial code, no intubation or chest compressions Family Communication: Discussed with daughter at the bedside Disposition Plan: Discharge home once improved   Consultants:     Procedures:     Antimicrobials:  Ceftriaxone 1/24 >  Azithromycin 1/24 >   Subjective: Feels that breathing was improving.  Briefly required BiPAP  overnight, but did not tolerated very well.  Continues to have productive cough.  Wheezing is better.  Objective: Vitals:   03/21/17 1420 03/21/17 1500 03/21/17 1600 03/21/17 1700  BP:  (!) 115/91 104/80 108/89  Pulse:  (!) 108 89 88  Resp:  (!) 21 (!) 22 (!) 23  Temp:      TempSrc:      SpO2: 93% 93% 91% 90%  Weight:      Height:        Intake/Output Summary (Last 24 hours) at 03/21/2017 1837 Last data filed at 03/21/2017 1200 Gross per 24 hour  Intake 723 ml  Output 500 ml  Net 223 ml   Filed Weights   03/20/17 2237 03/21/17 0129 03/21/17 0500  Weight: 103.2 kg (227 lb 8.2 oz) 97.3 kg (214 lb 8.1 oz) 97.5 kg (214 lb 15.2 oz)    Examination:  General exam: Appears calm and comfortable  Respiratory system: Diminished breath sounds with wheeze bilaterally.  Right lower lobe crackles.  Respiratory effort normal. Cardiovascular system: S1 & S2 heard, irregular.  No JVD, murmurs, rubs, gallops or clicks. 1+ pedal edema. Gastrointestinal system: Abdomen is nondistended, soft and nontender. No organomegaly or masses felt. Normal bowel sounds heard. Central nervous system: Alert and oriented. No focal neurological deficits. Extremities: Symmetric 5 x 5 power. Skin: No rashes, lesions or ulcers Psychiatry: Judgement and insight appear normal. Mood & affect appropriate.     Data Reviewed: I have personally reviewed following labs and imaging studies  CBC: Recent Labs  Lab 03/20/17 1908 03/21/17 0300  WBC 11.2* 11.6*  NEUTROABS 9.3* 10.4*  HGB 16.0 16.5  HCT 50.0 50.8  MCV 98.0 97.1  PLT 163 017*   Basic Metabolic Panel: Recent Labs  Lab 03/20/17 1908 03/21/17 0300  NA 139 140  K 3.5 3.7  CL 102 102  CO2 24 22  GLUCOSE 94 144*  BUN 27* 29*  CREATININE 0.81 0.92  CALCIUM 8.8* 9.0   GFR: Estimated Creatinine Clearance: 73.1 mL/min (by C-G formula based on SCr of 0.92 mg/dL). Liver Function Tests: Recent Labs  Lab 03/20/17 1908  AST 27  ALT 17  ALKPHOS 60    BILITOT 3.3*  PROT 6.7  ALBUMIN 3.5   Recent Labs  Lab 03/20/17 1909  LIPASE 25   No results for input(s): AMMONIA in the last 168 hours. Coagulation Profile: Recent Labs  Lab 03/20/17 1908  INR 1.39   Cardiac Enzymes: No results for input(s): CKTOTAL, CKMB, CKMBINDEX, TROPONINI in the last 168 hours. BNP (last 3 results) No results for input(s): PROBNP in the last 8760 hours. HbA1C: No results for input(s): HGBA1C in the last 72 hours. CBG: No results for input(s): GLUCAP in the last 168 hours. Lipid Profile: No results for input(s): CHOL, HDL, LDLCALC, TRIG, CHOLHDL, LDLDIRECT in the last 72 hours. Thyroid Function Tests: No results for input(s): TSH, T4TOTAL, FREET4, T3FREE, THYROIDAB in the last 72 hours. Anemia Panel: No results for input(s): VITAMINB12, FOLATE, FERRITIN, TIBC, IRON, RETICCTPCT in the last 72 hours. Sepsis Labs: Recent Labs  Lab 03/20/17 1927 03/20/17 2329 03/21/17 0300  LATICACIDVEN 2.09* 2.2* 2.7*    Recent Results (from the past 240 hour(s))  Culture, blood (Routine x 2)     Status: None (Preliminary result)   Collection Time: 03/20/17  7:09 PM  Result Value Ref Range Status   Specimen Description RIGHT ANTECUBITAL  Final   Special Requests   Final    BOTTLES DRAWN AEROBIC ONLY Blood Culture adequate volume   Culture NO GROWTH < 12 HOURS  Final   Report Status PENDING  Incomplete  Culture, blood (Routine x 2)     Status: None (Preliminary result)   Collection Time: 03/20/17  7:09 PM  Result Value Ref Range Status   Specimen Description BLOOD LEFT HAND  Final   Special Requests   Final    BOTTLES DRAWN AEROBIC ONLY Blood Culture adequate volume   Culture NO GROWTH < 12 HOURS  Final   Report Status PENDING  Incomplete  MRSA PCR Screening     Status: None   Collection Time: 03/21/17  1:27 AM  Result Value Ref Range Status   MRSA by PCR NEGATIVE NEGATIVE Final    Comment:        The GeneXpert MRSA Assay (FDA approved for NASAL  specimens only), is one component of a comprehensive MRSA colonization surveillance program. It is not intended to diagnose MRSA infection nor to guide or monitor treatment for MRSA infections.          Radiology Studies: Dg Chest 2 View  Result Date: 03/20/2017 CLINICAL DATA:  Generalize weakness with decreased appetite and short of breath EXAM: CHEST  2 VIEW COMPARISON:  01/04/2014 FINDINGS: Suspected moderate right pleural effusion. Cardiomegaly with vascular congestion and mild pulmonary edema. Aortic atherosclerosis. No pneumothorax. Mild degenerative changes of the spine. Airspace disease at the right middle lobe and lung base. IMPRESSION: 1. Cardiomegaly with vascular congestion and mild pulmonary edema. Suspected moderate right pleural effusion 2. Airspace disease at the right middle lobe and lung base could reflect superimposed pneumonia Electronically Signed   By: Donavan Foil M.D.   On: 03/20/2017 19:07  Scheduled Meds: . apixaban  5 mg Oral BID  . colchicine  0.6 mg Oral BID  . furosemide  40 mg Oral Daily  . guaiFENesin  1,200 mg Oral BID  . HYDROcodone-acetaminophen  1 tablet Oral Q6H  . ipratropium-albuterol  3 mL Nebulization Q4H  . mouth rinse  15 mL Mouth Rinse BID  . methylPREDNISolone (SOLU-MEDROL) injection  80 mg Intravenous Q12H  . metoprolol tartrate  50 mg Oral BID  . sodium chloride flush  3 mL Intravenous Q12H   Continuous Infusions: . sodium chloride    . azithromycin    . cefTRIAXone (ROCEPHIN)  IV       LOS: 1 day    Time spent: 87mins    Kathie Dike, MD [facility] Pager [phone]  If 7PM-7AM, please contact night-coverage [email or web address] Password [facility] 03/21/2017, 6:37 PM

## 2017-03-21 NOTE — Progress Notes (Signed)
Patient taken off BiPAP and place on [place] 3 L by Respiratory therapist. Patient saturating at 93-95%

## 2017-03-21 NOTE — Care Management Note (Signed)
Case Management Note  Patient Details  Name: Brett Carey MRN: [id number] Date of Birth: 1940-08-29  Subjective/Objective:    Adm from home. Patient reports ind with ADL's. No HH or DME at home. Reports daughter "got me oxygen at home, but I don't need it".  Adm with PNA. Takes care of wife with dementia. Daughter is caring for wife while patient is in the hospital.  Has PCP-Dr. Hall-reports daughter drives him to appointments. We discuss Home health-(I share that per attending, his daughter is requesting home health), patient adamantly declines home health.                 Action/Plan: CM will follow. Will discuss home health with patient again closer to DC time. Patient will need a PT eval prior to DC to assess for needs.    Expected Discharge Date:   unk               Expected Discharge Plan:     In-House Referral:     Discharge planning Services  CM Consult  Post Acute Care Choice:    Choice offered to:  Patient  DME Arranged:    DME Agency:     HH Arranged:  Patient Refused [place] Agency:     Status of Service:  In process, will continue to follow  If discussed at Long Length of Stay Meetings, dates discussed:    Additional Comments:  Noah Lembke, Chauncey [place], RN 03/21/2017, 2:48 PM

## 2017-03-21 NOTE — Progress Notes (Signed)
Initial Nutrition Assessment  DOCUMENTATION CODES:   Non-severe (moderate) malnutrition in context of acute illness/injury  INTERVENTION:  Ensure Enlive po BID, each supplement provides 350 kcal and 20 grams of protein   Nutrition staff to obtain food preferences when diet fully advanced  Provided daughter Brett Carey with multiple Boost coupons to offset cost of supplements for him at home  Recommend he continue to consume high protein oral supplement BID for at least 1 month following discharge.   NUTRITION DIAGNOSIS:   Moderate Malnutrition related to acute illness(Pneumonia) as evidenced by per patient/family report, mild fat depletion, mild muscle depletion.   GOAL:   Patient will meet greater than or equal to 90% of their needs  MONITOR:   Diet advancement, PO intake, Supplement acceptance, Labs, Weight trends  REASON FOR ASSESSMENT:   Malnutrition Screening Tool    ASSESSMENT:  Brett Carey is a 77 yo male who presents with shortness of breath. Hx of COPD, HF, HTN and gout. He lives with wife who has dementia. He was on BiPAP earlier today but has been able to wean to nasal canula 3 L. Chest x-ray -suspect for right PE.  His daughter Brett Carey provided recent diet hx. Last Saturday he ate nothing all day, Brett Carey had a bowl of cereal, Monday no intake, Tues. Pimento cheese sandwich and Wednesday - Boost.  He is still on liquid diet currently. Will add oral nutrition supplement given his poor intake prior to admission.  His weight is down from 223 lb at January Dr appointment. He has lower extremity edema also which is likely masking additional wt loss.   Meds: lasix, colchicine, lopressor and prednisone    Recent Labs  Lab 03/20/17 1908 03/21/17 0300  NA 139 140  K 3.5 3.7  CL 102 102  CO2 24 22  BUN 27* 29*  CREATININE 0.81 0.92  CALCIUM 8.8* 9.0  GLUCOSE 94 144*   Labs: reviewed.   NUTRITION - FOCUSED PHYSICAL EXAM:    Most Recent Value  Orbital Region  Mild  depletion  Upper Arm Region  No depletion  Thoracic and Lumbar Region  No depletion  Buccal Region  Mild depletion  Temple Region  Mild depletion  Clavicle Bone Region  Mild depletion  Clavicle and Acromion Bone Region  Moderate depletion  Scapular Bone Region  No depletion  Dorsal Hand  Moderate depletion  Patellar Region  No depletion  Anterior Thigh Region  No depletion  Posterior Calf Region  Mild depletion  Edema (RD Assessment)  Mild  Hair  Reviewed  Eyes  Unable to assess  Mouth  Unable to assess  Skin  Reviewed  Nails  Reviewed      Diet Order:  Diet full liquid Room service appropriate? Yes; Fluid consistency: Thin  EDUCATION NEEDS:   No education needs have been identified at this time Skin:  Skin Assessment: Reviewed RN Assessment  Last BM:  1/24 small  Height:   Ht Readings from Last 1 Encounters:  03/21/17 5\' 4"  (1.626 m)    Weight:   Wt Readings from Last 1 Encounters:  03/21/17 214 lb 15.2 oz (97.5 kg)    Ideal Body Weight:  59 kg  BMI:  Body mass index is 36.9 kg/m.  Estimated Nutritional Needs:   Kcal:  2134-2328  Protein:  110-120 gr  Fluid:  per MD goal  Colman Cater MS,RD,CSG,LDN Office: [phone] Pager: [phone]

## 2017-03-21 NOTE — Plan of Care (Signed)
Report called to Thayer Headings, RN in stepdown.

## 2017-03-21 NOTE — Progress Notes (Signed)
Pt placed on BIPAP due to increased RR & WOB.

## 2017-03-22 ENCOUNTER — Inpatient Hospital Stay (HOSPITAL_COMMUNITY): Payer: Medicare HMO

## 2017-03-22 LAB — BASIC METABOLIC PANEL
ANION GAP: 18 — AB (ref 5–15)
BUN: 52 mg/dL — ABNORMAL HIGH (ref 6–20)
CALCIUM: 8.4 mg/dL — AB (ref 8.9–10.3)
CO2: 20 mmol/L — ABNORMAL LOW (ref 22–32)
Chloride: 99 mmol/L — ABNORMAL LOW (ref 101–111)
Creatinine, Ser: 1.85 mg/dL — ABNORMAL HIGH (ref 0.61–1.24)
GFR, EST AFRICAN AMERICAN: 39 mL/min — AB (ref >60)
GFR, EST NON AFRICAN AMERICAN: 34 mL/min — AB (ref >60)
Glucose, Bld: 159 mg/dL — ABNORMAL HIGH (ref 65–99)
POTASSIUM: 4.1 mmol/L (ref 3.5–5.1)
SODIUM: 137 mmol/L (ref 135–145)

## 2017-03-22 LAB — CBC
HCT: 50.4 % (ref 39.0–52.0)
Hemoglobin: 16.2 g/dL (ref 13.0–17.0)
MCH: 30.9 pg (ref 26.0–34.0)
MCHC: 32.1 g/dL (ref 30.0–36.0)
MCV: 96.2 fL (ref 78.0–100.0)
PLATELETS: 143 10*3/uL — AB (ref 150–400)
RBC: 5.24 MIL/uL (ref 4.22–5.81)
RDW: 17.4 % — AB (ref 11.5–15.5)
WBC: 14.8 10*3/uL — AB (ref 4.0–10.5)

## 2017-03-22 MED ORDER — SODIUM CHLORIDE 0.9 % IV SOLN
INTRAVENOUS | Status: DC
  Administered 2017-03-22 – 2017-03-24 (×4): via INTRAVENOUS

## 2017-03-22 MED ORDER — METOPROLOL TARTRATE 25 MG PO TABS
25.0000 mg | ORAL_TABLET | Freq: Two times a day (BID) | ORAL | Status: DC
  Filled 2017-03-22: qty 1

## 2017-03-22 MED ORDER — PREDNISONE 20 MG PO TABS
40.0000 mg | ORAL_TABLET | Freq: Every day | ORAL | Status: DC
  Administered 2017-03-23 – 2017-03-24 (×2): 40 mg via ORAL
  Filled 2017-03-22 (×2): qty 2

## 2017-03-22 NOTE — Progress Notes (Addendum)
PROGRESS NOTE    Brett Carey  GMW:[id number] DOB: 1940-06-14 DOA: 03/20/2017 PCP: Celene Squibb, MD    Brief Narrative:  77 year old male with a history of COPD, atrial fibrillation, presented to the hospital with progressive shortness of breath.  Found to have COPD exacerbation as well as community-acquired pneumonia.  He had respiratory failure requiring BiPAP therapy, now on nasal cannula.  Currently on IV steroids, antibiotics and bronchodilators.   Assessment & Plan:   Principal Problem:   PNA (pneumonia) Active Problems:   Atrial fibrillation (HCC)   Benign essential HTN   Acute respiratory failure with hypoxia (HCC)   Community acquired pneumonia of right middle lobe of lung (HCC)   COPD (chronic obstructive pulmonary disease) (HCC)   CHF (congestive heart failure) (HCC)   Malnutrition of moderate degree   1. Acute respiratory failure with hypoxia.  Related to pneumonia and COPD exacerbation.  Briefly required BiPAP, now on nasal cannula.  Continue to wean off oxygen as tolerated. 2. Community-acquired pneumonia.  Currently on Rocephin and azithromycin.  Continue pulmonary hygiene. 3. COPD exacerbation.  Wheezing has improved with treatment.  Change solumedrol to prednisone. Continue neb treatments 4. Chronic diastolic congestive heart failure.  Patient does have some lower extremity edema, but this is reported to be chronic and better than baseline at this time.  5. Chronic atrial fibrillation.  Currently rate controlled.  Continue on beta-blockers.  Anticoagulated with apixaban. 6. AKI. Creatinine has bumped since yesterday to 1.8. Urine output has been poor. Blood pressures have been running low, which may have led to ATN. Will check renal ultrasound. Give a trial of hydration. Discontinue colchicine and lasix for now.   DVT prophylaxis: Apixaban Code Status: Partial code, no intubation or chest compressions Family Communication: Discussed with daughter at the  bedside Disposition Plan: Discharge home once improved   Consultants:     Procedures:     Antimicrobials:  Ceftriaxone 1/24 >  Azithromycin 1/24 >   Subjective: Shortness of breath improving. Wheezing better. He is sleepy today.  Objective: Vitals:   03/22/17 0400 03/22/17 0500 03/22/17 0600 03/22/17 0820  BP: (!) 89/66 (!) 80/69 (!) 82/46   Pulse: 88 80 85   Resp: 20 18 20    Temp: 97.6 F (36.4 C)   97.8 F (36.6 C)  TempSrc: Oral   Axillary  SpO2: 96% 96% 96% 97%  Weight: 98.2 kg (216 lb 7.9 oz)     Height:        Intake/Output Summary (Last 24 hours) at 03/22/2017 0958 Last data filed at 03/22/2017 0500 Gross per 24 hour  Intake 480 ml  Output 200 ml  Net 280 ml   Filed Weights   03/21/17 0129 03/21/17 0500 03/22/17 0400  Weight: 97.3 kg (214 lb 8.1 oz) 97.5 kg (214 lb 15.2 oz) 98.2 kg (216 lb 7.9 oz)    Examination:  General exam: Appears calm and comfortable  Respiratory system: no wheezing. Improved air entry bilaterally.  Right lower lobe crackles.  Respiratory effort normal. Cardiovascular system: S1 & S2 heard, irregular.  No JVD, murmurs, rubs, gallops or clicks. 1+ pedal edema. Gastrointestinal system: Abdomen is nondistended, soft and nontender. No organomegaly or masses felt. Normal bowel sounds heard. Central nervous system: Alert and oriented. No focal neurological deficits. Extremities: Symmetric 5 x 5 power. Skin: venous stasis changes in LE bilaterally Psychiatry: Judgement and insight appear normal. Mood & affect appropriate.     Data Reviewed: I have personally reviewed following labs and imaging  studies  CBC: Recent Labs  Lab 03/20/17 1908 03/21/17 0300 03/22/17 0421  WBC 11.2* 11.6* 14.8*  NEUTROABS 9.3* 10.4*  --   HGB 16.0 16.5 16.2  HCT 50.0 50.8 50.4  MCV 98.0 97.1 96.2  PLT 163 145* 431*   Basic Metabolic Panel: Recent Labs  Lab 03/20/17 1908 03/21/17 0300 03/22/17 0421  NA 139 140 137  K 3.5 3.7 4.1  CL 102  102 99*  CO2 24 22 20*  GLUCOSE 94 144* 159*  BUN 27* 29* 52*  CREATININE 0.81 0.92 1.85*  CALCIUM 8.8* 9.0 8.4*   GFR: Estimated Creatinine Clearance: 36.5 mL/min (A) (by C-G formula based on SCr of 1.85 mg/dL (H)). Liver Function Tests: Recent Labs  Lab 03/20/17 1908  AST 27  ALT 17  ALKPHOS 60  BILITOT 3.3*  PROT 6.7  ALBUMIN 3.5   Recent Labs  Lab 03/20/17 1909  LIPASE 25   No results for input(s): AMMONIA in the last 168 hours. Coagulation Profile: Recent Labs  Lab 03/20/17 1908  INR 1.39   Cardiac Enzymes: No results for input(s): CKTOTAL, CKMB, CKMBINDEX, TROPONINI in the last 168 hours. BNP (last 3 results) No results for input(s): PROBNP in the last 8760 hours. HbA1C: No results for input(s): HGBA1C in the last 72 hours. CBG: No results for input(s): GLUCAP in the last 168 hours. Lipid Profile: No results for input(s): CHOL, HDL, LDLCALC, TRIG, CHOLHDL, LDLDIRECT in the last 72 hours. Thyroid Function Tests: No results for input(s): TSH, T4TOTAL, FREET4, T3FREE, THYROIDAB in the last 72 hours. Anemia Panel: No results for input(s): VITAMINB12, FOLATE, FERRITIN, TIBC, IRON, RETICCTPCT in the last 72 hours. Sepsis Labs: Recent Labs  Lab 03/20/17 1927 03/20/17 2329 03/21/17 0300  LATICACIDVEN 2.09* 2.2* 2.7*    Recent Results (from the past 240 hour(s))  Culture, blood (Routine x 2)     Status: None (Preliminary result)   Collection Time: 03/20/17  7:09 PM  Result Value Ref Range Status   Specimen Description RIGHT ANTECUBITAL  Final   Special Requests   Final    BOTTLES DRAWN AEROBIC ONLY Blood Culture adequate volume   Culture NO GROWTH 2 DAYS  Final   Report Status PENDING  Incomplete  Culture, blood (Routine x 2)     Status: None (Preliminary result)   Collection Time: 03/20/17  7:09 PM  Result Value Ref Range Status   Specimen Description BLOOD LEFT HAND  Final   Special Requests   Final    BOTTLES DRAWN AEROBIC ONLY Blood Culture  adequate volume   Culture NO GROWTH 2 DAYS  Final   Report Status PENDING  Incomplete  MRSA PCR Screening     Status: None   Collection Time: 03/21/17  1:27 AM  Result Value Ref Range Status   MRSA by PCR NEGATIVE NEGATIVE Final    Comment:        The GeneXpert MRSA Assay (FDA approved for NASAL specimens only), is one component of a comprehensive MRSA colonization surveillance program. It is not intended to diagnose MRSA infection nor to guide or monitor treatment for MRSA infections.          Radiology Studies: Dg Chest 2 View  Result Date: 03/20/2017 CLINICAL DATA:  Generalize weakness with decreased appetite and short of breath EXAM: CHEST  2 VIEW COMPARISON:  01/04/2014 FINDINGS: Suspected moderate right pleural effusion. Cardiomegaly with vascular congestion and mild pulmonary edema. Aortic atherosclerosis. No pneumothorax. Mild degenerative changes of the spine. Airspace disease at  the right middle lobe and lung base. IMPRESSION: 1. Cardiomegaly with vascular congestion and mild pulmonary edema. Suspected moderate right pleural effusion 2. Airspace disease at the right middle lobe and lung base could reflect superimposed pneumonia Electronically Signed   By: Donavan Foil M.D.   On: 03/20/2017 19:07        Scheduled Meds: . apixaban  5 mg Oral BID  . guaiFENesin  1,200 mg Oral BID  . HYDROcodone-acetaminophen  1 tablet Oral Q6H  . ipratropium-albuterol  3 mL Nebulization Q4H  . mouth rinse  15 mL Mouth Rinse BID  . metoprolol tartrate  25 mg Oral BID  . [START ON 03/23/2017] predniSONE  40 mg Oral Q breakfast  . sodium chloride flush  3 mL Intravenous Q12H   Continuous Infusions: . sodium chloride    . sodium chloride    . azithromycin 500 mg (03/21/17 2149)  . cefTRIAXone (ROCEPHIN)  IV Stopped (03/21/17 2218)     LOS: 2 days    Time spent: 63mins    Kathie Dike, MD [facility] Pager [phone]  If 7PM-7AM, please contact  night-coverage [email or web address] Password [facility] 03/22/2017, 9:58 AM

## 2017-03-22 NOTE — Progress Notes (Signed)
Pt placed on HFNC due to decreased O2 saturation. Pt doesn't wish to wear BIPAP but is agreeable to HFNC.

## 2017-03-23 LAB — CBC
HCT: 50.6 % (ref 39.0–52.0)
HEMOGLOBIN: 16.3 g/dL (ref 13.0–17.0)
MCH: 31.2 pg (ref 26.0–34.0)
MCHC: 32.2 g/dL (ref 30.0–36.0)
MCV: 96.7 fL (ref 78.0–100.0)
Platelets: 125 10*3/uL — ABNORMAL LOW (ref 150–400)
RBC: 5.23 MIL/uL (ref 4.22–5.81)
RDW: 16.6 % — ABNORMAL HIGH (ref 11.5–15.5)
WBC: 16.2 10*3/uL — ABNORMAL HIGH (ref 4.0–10.5)

## 2017-03-23 LAB — HEPATIC FUNCTION PANEL
ALK PHOS: 63 U/L (ref 38–126)
ALT: 422 U/L — AB (ref 17–63)
AST: 434 U/L — AB (ref 15–41)
Albumin: 3.2 g/dL — ABNORMAL LOW (ref 3.5–5.0)
BILIRUBIN INDIRECT: 1 mg/dL — AB (ref 0.3–0.9)
Bilirubin, Direct: 1.2 mg/dL — ABNORMAL HIGH (ref 0.1–0.5)
TOTAL PROTEIN: 6.2 g/dL — AB (ref 6.5–8.1)
Total Bilirubin: 2.2 mg/dL — ABNORMAL HIGH (ref 0.3–1.2)

## 2017-03-23 LAB — BASIC METABOLIC PANEL
ANION GAP: 15 (ref 5–15)
BUN: 73 mg/dL — ABNORMAL HIGH (ref 6–20)
CO2: 20 mmol/L — AB (ref 22–32)
Calcium: 7.7 mg/dL — ABNORMAL LOW (ref 8.9–10.3)
Chloride: 101 mmol/L (ref 101–111)
Creatinine, Ser: 1.86 mg/dL — ABNORMAL HIGH (ref 0.61–1.24)
GFR calc non Af Amer: 34 mL/min — ABNORMAL LOW (ref >60)
GFR, EST AFRICAN AMERICAN: 39 mL/min — AB (ref >60)
Glucose, Bld: 90 mg/dL (ref 65–99)
Potassium: 4.7 mmol/L (ref 3.5–5.1)
Sodium: 136 mmol/L (ref 135–145)

## 2017-03-23 MED ORDER — METOPROLOL TARTRATE 25 MG PO TABS
12.5000 mg | ORAL_TABLET | Freq: Two times a day (BID) | ORAL | Status: DC
  Administered 2017-03-23 – 2017-03-27 (×9): 12.5 mg via ORAL
  Filled 2017-03-23 (×9): qty 1

## 2017-03-23 MED ORDER — IPRATROPIUM-ALBUTEROL 0.5-2.5 (3) MG/3ML IN SOLN
3.0000 mL | Freq: Three times a day (TID) | RESPIRATORY_TRACT | Status: DC
  Administered 2017-03-23 – 2017-04-02 (×31): 3 mL via RESPIRATORY_TRACT
  Filled 2017-03-23 (×30): qty 3

## 2017-03-23 MED ORDER — ENSURE ENLIVE PO LIQD
237.0000 mL | Freq: Two times a day (BID) | ORAL | Status: DC
  Administered 2017-03-23 – 2017-04-02 (×18): 237 mL via ORAL

## 2017-03-23 NOTE — Progress Notes (Signed)
PROGRESS NOTE    Brett Carey  FAO:[id number] DOB: 1940-04-13 DOA: 03/20/2017 PCP: Celene Squibb, MD    Brief Narrative:  77 year old male with a history of COPD, atrial fibrillation, presented to the hospital with progressive shortness of breath.  Found to have COPD exacerbation as well as community-acquired pneumonia.  He had respiratory failure requiring BiPAP therapy, now on nasal cannula.  Currently on IV steroids, antibiotics and bronchodilators.   Assessment & Plan:   Principal Problem:   PNA (pneumonia) Active Problems:   Atrial fibrillation (HCC)   Benign essential HTN   Acute respiratory failure with hypoxia (HCC)   Community acquired pneumonia of right middle lobe of lung (HCC)   COPD (chronic obstructive pulmonary disease) (HCC)   CHF (congestive heart failure) (HCC)   Malnutrition of moderate degree   1. Acute respiratory failure with hypoxia.  Related to pneumonia and COPD exacerbation.  Briefly required BiPAP, now on nasal cannula.  Continue to wean off oxygen as tolerated. 2. Community-acquired pneumonia.  Currently on Rocephin and azithromycin.  Continue pulmonary hygiene. 3. COPD exacerbation.  Wheezing has improved with treatment.  Continue on prednisone. Continue neb treatments 4. Chronic diastolic congestive heart failure.  Patient does have some lower extremity edema, but this is reported to be chronic and better than baseline at this time.  5. Chronic atrial fibrillation.  Heart rate is trending up.  Beta-blockers are being intermittently held due to blood pressure.  Continue metoprolol as tolerated.  Anticoagulated with apixaban. 6. AKI. Creatinine has bumped since yesterday to 1.8.  Suspect this may be related to hypotension leading to ATN.  Patient started on gentle hydration.  Overall urine output has been improving.  Blood pressure is better today.  Renal ultrasound is unremarkable. 7. Elevated liver enzymes.  No abdominal pain, nausea or vomiting.  Suspect  this is reactive to hypotension.  Will recheck in a.m.  Check abdominal ultrasound in a.m.   DVT prophylaxis: Apixaban Code Status: Partial code, no intubation or chest compressions Family Communication: Discussed with daughter at the bedside Disposition Plan: Discharge home once improved   Consultants:     Procedures:     Antimicrobials:  Ceftriaxone 1/24 >  Azithromycin 1/24 >   Subjective: Feeling better today.  Shortness of breath improving.  Less lethargic today.  Does not have much cough.  Objective: Vitals:   03/23/17 1205 03/23/17 1300 03/23/17 1400 03/23/17 1500  BP:  102/87 118/84 (!) 115/92  Pulse:  (!) 107    Resp:  (!) 26 (!) 22 19  Temp: 97.9 F (36.6 C)     TempSrc: Axillary     SpO2: 94% 90%    Weight:      Height:        Intake/Output Summary (Last 24 hours) at 03/23/2017 1547 Last data filed at 03/23/2017 1300 Gross per 24 hour  Intake 1946.67 ml  Output 1175 ml  Net 771.67 ml   Filed Weights   03/21/17 0500 03/22/17 0400 03/23/17 0500  Weight: 97.5 kg (214 lb 15.2 oz) 98.2 kg (216 lb 7.9 oz) 98.2 kg (216 lb 7.9 oz)    Examination:  General exam: Appears calm and comfortable  Respiratory system: no wheezing. Improved air entry bilaterally.  Right lower lobe crackles.  Respiratory effort normal. Cardiovascular system: S1 & S2 heard, irregular.  No JVD, murmurs, rubs, gallops or clicks. 1+ pedal edema. Gastrointestinal system: Abdomen is nondistended, soft and nontender. No organomegaly or masses felt. Normal bowel sounds heard. Central nervous  system: Alert and oriented. No focal neurological deficits. Extremities: Symmetric 5 x 5 power. Skin: venous stasis changes in LE bilaterally Psychiatry: Judgement and insight appear normal. Mood & affect appropriate.     Data Reviewed: I have personally reviewed following labs and imaging studies  CBC: Recent Labs  Lab 03/20/17 1908 03/21/17 0300 03/22/17 0421 03/23/17 0821  WBC 11.2*  11.6* 14.8* 16.2*  NEUTROABS 9.3* 10.4*  --   --   HGB 16.0 16.5 16.2 16.3  HCT 50.0 50.8 50.4 50.6  MCV 98.0 97.1 96.2 96.7  PLT 163 145* 143* 973*   Basic Metabolic Panel: Recent Labs  Lab 03/20/17 1908 03/21/17 0300 03/22/17 0421 03/23/17 0821  NA 139 140 137 136  K 3.5 3.7 4.1 4.7  CL 102 102 99* 101  CO2 24 22 20* 20*  GLUCOSE 94 144* 159* 90  BUN 27* 29* 52* 73*  CREATININE 0.81 0.92 1.85* 1.86*  CALCIUM 8.8* 9.0 8.4* 7.7*   GFR: Estimated Creatinine Clearance: 36.3 mL/min (A) (by C-G formula based on SCr of 1.86 mg/dL (H)). Liver Function Tests: Recent Labs  Lab 03/20/17 1908 03/23/17 0821  AST 27 434*  ALT 17 422*  ALKPHOS 60 63  BILITOT 3.3* 2.2*  PROT 6.7 6.2*  ALBUMIN 3.5 3.2*   Recent Labs  Lab 03/20/17 1909  LIPASE 25   No results for input(s): AMMONIA in the last 168 hours. Coagulation Profile: Recent Labs  Lab 03/20/17 1908  INR 1.39   Cardiac Enzymes: No results for input(s): CKTOTAL, CKMB, CKMBINDEX, TROPONINI in the last 168 hours. BNP (last 3 results) No results for input(s): PROBNP in the last 8760 hours. HbA1C: No results for input(s): HGBA1C in the last 72 hours. CBG: No results for input(s): GLUCAP in the last 168 hours. Lipid Profile: No results for input(s): CHOL, HDL, LDLCALC, TRIG, CHOLHDL, LDLDIRECT in the last 72 hours. Thyroid Function Tests: No results for input(s): TSH, T4TOTAL, FREET4, T3FREE, THYROIDAB in the last 72 hours. Anemia Panel: No results for input(s): VITAMINB12, FOLATE, FERRITIN, TIBC, IRON, RETICCTPCT in the last 72 hours. Sepsis Labs: Recent Labs  Lab 03/20/17 1927 03/20/17 2329 03/21/17 0300  LATICACIDVEN 2.09* 2.2* 2.7*    Recent Results (from the past 240 hour(s))  Culture, blood (Routine x 2)     Status: None (Preliminary result)   Collection Time: 03/20/17  7:09 PM  Result Value Ref Range Status   Specimen Description RIGHT ANTECUBITAL  Final   Special Requests   Final    BOTTLES DRAWN  AEROBIC ONLY Blood Culture adequate volume   Culture NO GROWTH 3 DAYS  Final   Report Status PENDING  Incomplete  Culture, blood (Routine x 2)     Status: None (Preliminary result)   Collection Time: 03/20/17  7:09 PM  Result Value Ref Range Status   Specimen Description BLOOD LEFT HAND  Final   Special Requests   Final    BOTTLES DRAWN AEROBIC ONLY Blood Culture adequate volume   Culture NO GROWTH 3 DAYS  Final   Report Status PENDING  Incomplete  MRSA PCR Screening     Status: None   Collection Time: 03/21/17  1:27 AM  Result Value Ref Range Status   MRSA by PCR NEGATIVE NEGATIVE Final    Comment:        The GeneXpert MRSA Assay (FDA approved for NASAL specimens only), is one component of a comprehensive MRSA colonization surveillance program. It is not intended to diagnose MRSA infection nor to  guide or monitor treatment for MRSA infections.          Radiology Studies: US Renal  Result Date: 03/22/2017 CLINICAL DATA:  Hypertension.  Acute kidney injury. EXAM: RENAL / URINARY TRACT ULTRASOUND COMPLETE COMPARISON:  None. FINDINGS: Right Kidney: Length: 10.9 cm. Mild cortical thinning. No hydronephrosis. 2 cm cyst in the lower pole. Left Kidney: Length: 11.0 cm. Cortical thickness appears minimally thin end. Multiple cysts, the largest measuring 1.8 cm in the midportion. Bladder: Foley catheter in place. Some ascites is noted.  Probable cirrhosis of the liver. IMPRESSION: Kidneys are normal in size but show mild cortical thinning, right more than left. No hydronephrosis or significant focal lesion. Benign appearing simple cysts. Foley catheter in the bladder. Probable cirrhosis and ascites. Electronically Signed   By: Nelson Chimes M.D.   On: 03/22/2017 14:26        Scheduled Meds: . apixaban  5 mg Oral BID  . feeding supplement (ENSURE ENLIVE)  237 mL Oral BID BM  . guaiFENesin  1,200 mg Oral BID  . HYDROcodone-acetaminophen  1 tablet Oral Q6H  . ipratropium-albuterol   3 mL Nebulization TID  . mouth rinse  15 mL Mouth Rinse BID  . metoprolol tartrate  12.5 mg Oral BID  . predniSONE  40 mg Oral Q breakfast  . sodium chloride flush  3 mL Intravenous Q12H   Continuous Infusions: . sodium chloride    . sodium chloride 100 mL/hr at 03/23/17 0728  . azithromycin Stopped (03/22/17 2150)  . cefTRIAXone (ROCEPHIN)  IV Stopped (03/22/17 2120)     LOS: 3 days    Time spent: 54mins    Kathie Dike, MD [facility] Pager [phone]  If 7PM-7AM, please contact night-coverage [email or web address] Password [facility] 03/23/2017, 3:47 PM

## 2017-03-23 NOTE — Progress Notes (Addendum)
Pt is not as lethargic today and is eating better. He is requesting ensure and boost drinks as well as increase in appetite. His work of breathing has decreased and he is maintaining well on 3L HFNC, did not require Bipap last night. 3 persons assist for pivoting from bed to chair but we got him up to chair around 11 am and will keep him up for a while and see how he does. He is in good spirits today. Pt has also had a small increase in urinary output.  Ericka Pontiff, RN 03/23/17 11:29 AM  Pt stayed out of bed and up to chair until 645 pm on 1/27. He tolerated that good. Started on Ensure nourishment shakes and has not needed the scheduled pain medication recently.  Lautaro Koral Rica Mote, RN

## 2017-03-24 ENCOUNTER — Inpatient Hospital Stay (HOSPITAL_COMMUNITY): Payer: Medicare HMO

## 2017-03-24 LAB — COMPREHENSIVE METABOLIC PANEL
ALBUMIN: 3 g/dL — AB (ref 3.5–5.0)
ALK PHOS: 62 U/L (ref 38–126)
ALT: 320 U/L — AB (ref 17–63)
ANION GAP: 13 (ref 5–15)
AST: 183 U/L — ABNORMAL HIGH (ref 15–41)
BILIRUBIN TOTAL: 2.1 mg/dL — AB (ref 0.3–1.2)
BUN: 67 mg/dL — ABNORMAL HIGH (ref 6–20)
CALCIUM: 8.3 mg/dL — AB (ref 8.9–10.3)
CO2: 22 mmol/L (ref 22–32)
CREATININE: 1.11 mg/dL (ref 0.61–1.24)
Chloride: 102 mmol/L (ref 101–111)
GFR calc Af Amer: >60 mL/min (ref >60)
GFR calc non Af Amer: >60 mL/min (ref >60)
Glucose, Bld: 107 mg/dL — ABNORMAL HIGH (ref 65–99)
Potassium: 4.5 mmol/L (ref 3.5–5.1)
Sodium: 137 mmol/L (ref 135–145)
TOTAL PROTEIN: 5.8 g/dL — AB (ref 6.5–8.1)

## 2017-03-24 MED ORDER — BISACODYL 5 MG PO TBEC
10.0000 mg | DELAYED_RELEASE_TABLET | Freq: Once | ORAL | Status: AC
  Administered 2017-03-24: 10 mg via ORAL
  Filled 2017-03-24: qty 2

## 2017-03-24 MED ORDER — PREDNISONE 20 MG PO TABS
30.0000 mg | ORAL_TABLET | Freq: Every day | ORAL | Status: DC
  Administered 2017-03-25 – 2017-03-31 (×7): 30 mg via ORAL
  Filled 2017-03-24 (×7): qty 1

## 2017-03-24 MED ORDER — POLYETHYLENE GLYCOL 3350 17 G PO PACK
17.0000 g | PACK | Freq: Every day | ORAL | Status: DC
  Administered 2017-03-24 – 2017-04-02 (×9): 17 g via ORAL
  Filled 2017-03-24 (×9): qty 1

## 2017-03-24 NOTE — Progress Notes (Signed)
Verbal order to remove foley cathter. Pt is now due to void post removal  Tawania Daponte Rica Mote, RN

## 2017-03-24 NOTE — Progress Notes (Signed)
Physical Therapy Evaluation Patient Details Name: Brett Carey MRN: [id number] DOB: [birth year] Today's Date: 03/24/2017   History of Present Illness  Brett Carey is a 77 y.o. male with medical history significant of COPD, CHF, A. fib on Eliquis, hypertension comes in with several days of worsening shortness of breath and cough.  Patient takes care of his wife who has dementia at home and was very reluctant and refusing to come to the hospital according to his daughter because of his concerns with his wife being at home alone.  His daughter arranged for mom to be taking care of and finally convinced him to come to the ED.  Daughter reports that he has been very short of breath since yesterday.  He has been coughing a lot.  Patient denies any fevers or chills.  He has been wheezing at home.  He is a smoker and he does have oxygen as needed at home.  He has not had any nausea vomiting or diarrhea.  He has not been recently hospitalized or on recent antibiotics.  Patient found to have pneumonia in the ED and referred for admission for pneumonia.  Per emergency room physician report patient was stable for MedSurg floor and was in no respiratory distress earlier.  On arrival to [place] floor however he is in moderate respiratory distress with very diminished breath sounds.  He actually says that he is not short of breath when obviously he is.    Clinical Impression  Patient requires head of bed raised and Mod assist to sit up at bedside demonstrating slow labored movement, limited to a few steps at bedside to transfer to chair due to BLE weakness, fatigue and difficulty breathing.  Patient tolerated sitting up in chair with BLE elevated.  Patient will benefit from continued physical therapy in hospital and recommended venue below to increase strength, balance, endurance for safe ADLs and gait.    Follow Up Recommendations SNF    Equipment Recommendations  Other (comment)(shower chair)     Recommendations for Other Services       Precautions / Restrictions Precautions Precautions: Fall Restrictions Weight Bearing Restrictions: No      Mobility  Bed Mobility Overal bed mobility: Needs Assistance Bed Mobility: Supine to Sit;Sit to Supine     Supine to sit: Mod assist Sit to supine: Mod assist      Transfers Overall transfer level: Needs assistance Equipment used: Rolling walker (2 wheeled) Transfers: Sit to/from [facility] Sit to Stand: Min assist;Mod assist Stand pivot transfers: Min assist;Mod assist       General transfer comment: slow labored movement  Ambulation/Gait Ambulation/Gait assistance: Mod assist Ambulation Distance (Feet): 3 Feet Assistive device: Rolling walker (2 wheeled) Gait Pattern/deviations: Decreased step length - right;Decreased step length - left;Decreased stride length   Gait velocity interpretation: Below normal speed for age/gender General Gait Details: limited to 5-6 side steps due to BLE weakness, fatigue, difficulty breathing, on 4 LPM  Stairs            Wheelchair Mobility    Modified Rankin (Stroke Patients Only)       Balance Overall balance assessment: Needs assistance Sitting-balance support: No upper extremity supported;Feet supported Sitting balance-Leahy Scale: Good     Standing balance support: Bilateral upper extremity supported;During functional activity Standing balance-Leahy Scale: Fair Standing balance comment: fair/poor with RW  Pertinent Vitals/Pain Pain Assessment: No/denies pain    Home Living Family/patient expects to be discharged to:: Private residence Living Arrangements: Spouse/significant other;Children Available Help at Discharge: Family Type of Home: Mobile home Home Access: Level entry     Home Layout: One level Home Equipment: Environmental consultant - 2 wheels;Bedside commode      Prior Function Level of Independence:  Independent               Hand Dominance        Extremity/Trunk Assessment   Upper Extremity Assessment Upper Extremity Assessment: Generalized weakness    Lower Extremity Assessment Lower Extremity Assessment: Generalized weakness    Cervical / Trunk Assessment Cervical / Trunk Assessment: Normal  Communication   Communication: No difficulties  Cognition Arousal/Alertness: Awake/alert Behavior During Therapy: WFL for tasks assessed/performed Overall Cognitive Status: Within Functional Limits for tasks assessed                                        General Comments      Exercises     Assessment/Plan    PT Assessment Patient needs continued PT services  PT Problem List Decreased range of motion;Decreased strength;Decreased balance;Decreased mobility       PT Treatment Interventions Gait training;Functional mobility training;Therapeutic activities;Therapeutic exercise;Patient/family education    PT Goals (Current goals can be found in the Care Plan section)  Acute Rehab PT Goals Patient Stated Goal: return home PT Goal Formulation: With patient/family Time For Goal Achievement: 04/07/17 Potential to Achieve Goals: Good    Frequency Min 3X/week   Barriers to discharge        Co-evaluation               AM-PAC PT "6 Clicks" Daily Activity  Outcome Measure Difficulty turning over in bed (including adjusting bedclothes, sheets and blankets)?: A Lot Difficulty moving from lying on back to sitting on the side of the bed? : A Lot Difficulty sitting down on and standing up from a chair with arms (e.g., wheelchair, bedside commode, etc,.)?: A Lot Help needed moving to and from a bed to chair (including a wheelchair)?: A Lot Help needed walking in hospital room?: A Lot Help needed climbing 3-5 steps with a railing? : Total 6 Click Score: 11    End of Session   Activity Tolerance: Patient limited by fatigue(patient limited secondary  to SOB) Patient left: in chair;with call bell/phone within reach;with family/visitor present Nurse Communication: Mobility status PT Visit Diagnosis: Unsteadiness on feet (R26.81);Other abnormalities of gait and mobility (R26.89);Muscle weakness (generalized) (M62.81)    Time: 1510-1535 PT Time Calculation (min) (ACUTE ONLY): 25 min   Charges:   PT Evaluation $PT Eval Moderate Complexity: 1 Mod PT Treatments $Therapeutic Activity: 23-37 mins   PT G Codes:        3:59 PM, 04-11-2017 Lonell Grandchild, MPT Physical Therapist with [facility] [phone] office [phone] mobile phone

## 2017-03-24 NOTE — Progress Notes (Signed)
PROGRESS NOTE    Brett Carey  YPP:[id number] DOB: 1940/12/03 DOA: 03/20/2017 PCP: Celene Squibb, MD    Brief Narrative:  77 year old male with a history of COPD, atrial fibrillation, presented to the hospital with progressive shortness of breath.  Found to have COPD exacerbation as well as community-acquired pneumonia.  He had respiratory failure requiring BiPAP therapy, now on nasal cannula.  Currently on IV steroids, antibiotics and bronchodilators.   Assessment & Plan:   Principal Problem:   PNA (pneumonia) Active Problems:   Atrial fibrillation (HCC)   Benign essential HTN   Acute respiratory failure with hypoxia (HCC)   Community acquired pneumonia of right middle lobe of lung (HCC)   COPD (chronic obstructive pulmonary disease) (HCC)   CHF (congestive heart failure) (HCC)   Malnutrition of moderate degree   1. Acute respiratory failure with hypoxia.  Related to pneumonia and COPD exacerbation.  Briefly required BiPAP, now on nasal cannula.  Continue to wean off oxygen as tolerated. 2. Community-acquired pneumonia.  Currently on Rocephin and azithromycin.  Continue pulmonary hygiene. 3. COPD exacerbation.  Wheezing has improved with treatment.  Continue on prednisone. Continue neb treatments 4. Chronic diastolic congestive heart failure.  Patient does have some lower extremity edema, but this is reported to be chronic and better than baseline at this time.  5. Chronic atrial fibrillation.  Heart rate is trending up.  Beta-blockers are being intermittently held due to blood pressure.  Continue metoprolol as tolerated.  Anticoagulated with apixaban. 6. AKI. Creatinine had bumped to 1.8.  Suspect this is related to hypotension leading to ATN.  Patient started on gentle hydration.  Overall urine output has been improving.  Blood pressure is better today.  Renal ultrasound is unremarkable.  Creatinine has since improved to 1.1 7. Elevated liver enzymes.  No abdominal pain, nausea or  vomiting.  Suspect this is reactive to hypotension.  Transaminases are improving with hydration.  Abdominal ultrasound showed nonspecific gallbladder wall thickening with stone, but clinically patient does not have any abdominal tenderness, nausea or vomiting.  Continue to trend 8. Cirrhosis.  Likely related to previous history of alcohol abuse.  Follow-up for further workup as an outpatient.   DVT prophylaxis: Apixaban Code Status: Partial code, no intubation or chest compressions Family Communication: Discussed with daughter at the bedside Disposition Plan: We will likely need skilled nursing facility placement on discharge   Consultants:     Procedures:     Antimicrobials:  Ceftriaxone 1/24 >  Azithromycin 1/24 >   Subjective: Does not have significant cough.  Although still short of breath, feels that he is improving.  No chest pain.  He is not had a bowel movement several days.  Objective: Vitals:   03/24/17 1300 03/24/17 1400 03/24/17 1505 03/24/17 1604  BP: 112/87     Pulse: 93 93  99  Resp: 18 18  20   Temp:    97.8 F (36.6 C)  TempSrc:    Oral  SpO2: 99% 100% 96% 92%  Weight:      Height:        Intake/Output Summary (Last 24 hours) at 03/24/2017 1721 Last data filed at 03/24/2017 1035 Gross per 24 hour  Intake 1599.33 ml  Output 1650 ml  Net -50.67 ml   Filed Weights   03/22/17 0400 03/23/17 0500 03/24/17 0500  Weight: 98.2 kg (216 lb 7.9 oz) 98.2 kg (216 lb 7.9 oz) 101.7 kg (224 lb 3.3 oz)    Examination:  General exam: Appears  calm and comfortable  Respiratory system: no wheezing. Improved air entry bilaterally.  Bilateral lower lobe crackles.  Respiratory effort normal. Cardiovascular system: S1 & S2 heard, irregular.  No JVD, murmurs, rubs, gallops or clicks. 1+ pedal edema. Gastrointestinal system: Abdomen is nondistended, soft and nontender. No organomegaly or masses felt. Normal bowel sounds heard. Central nervous system: Alert and oriented.  No focal neurological deficits. Extremities: Symmetric 5 x 5 power. Skin: venous stasis changes in LE bilaterally Psychiatry: Judgement and insight appear normal. Mood & affect appropriate.     Data Reviewed: I have personally reviewed following labs and imaging studies  CBC: Recent Labs  Lab 03/20/17 1908 03/21/17 0300 03/22/17 0421 03/23/17 0821  WBC 11.2* 11.6* 14.8* 16.2*  NEUTROABS 9.3* 10.4*  --   --   HGB 16.0 16.5 16.2 16.3  HCT 50.0 50.8 50.4 50.6  MCV 98.0 97.1 96.2 96.7  PLT 163 145* 143* 638*   Basic Metabolic Panel: Recent Labs  Lab 03/20/17 1908 03/21/17 0300 03/22/17 0421 03/23/17 0821 03/24/17 0453  NA 139 140 137 136 137  K 3.5 3.7 4.1 4.7 4.5  CL 102 102 99* 101 102  CO2 24 22 20* 20* 22  GLUCOSE 94 144* 159* 90 107*  BUN 27* 29* 52* 73* 67*  CREATININE 0.81 0.92 1.85* 1.86* 1.11  CALCIUM 8.8* 9.0 8.4* 7.7* 8.3*   GFR: Estimated Creatinine Clearance: 62 mL/min (by C-G formula based on SCr of 1.11 mg/dL). Liver Function Tests: Recent Labs  Lab 03/20/17 1908 03/23/17 0821 03/24/17 0453  AST 27 434* 183*  ALT 17 422* 320*  ALKPHOS 60 63 62  BILITOT 3.3* 2.2* 2.1*  PROT 6.7 6.2* 5.8*  ALBUMIN 3.5 3.2* 3.0*   Recent Labs  Lab 03/20/17 1909  LIPASE 25   No results for input(s): AMMONIA in the last 168 hours. Coagulation Profile: Recent Labs  Lab 03/20/17 1908  INR 1.39   Cardiac Enzymes: No results for input(s): CKTOTAL, CKMB, CKMBINDEX, TROPONINI in the last 168 hours. BNP (last 3 results) No results for input(s): PROBNP in the last 8760 hours. HbA1C: No results for input(s): HGBA1C in the last 72 hours. CBG: No results for input(s): GLUCAP in the last 168 hours. Lipid Profile: No results for input(s): CHOL, HDL, LDLCALC, TRIG, CHOLHDL, LDLDIRECT in the last 72 hours. Thyroid Function Tests: No results for input(s): TSH, T4TOTAL, FREET4, T3FREE, THYROIDAB in the last 72 hours. Anemia Panel: No results for input(s):  VITAMINB12, FOLATE, FERRITIN, TIBC, IRON, RETICCTPCT in the last 72 hours. Sepsis Labs: Recent Labs  Lab 03/20/17 1927 03/20/17 2329 03/21/17 0300  LATICACIDVEN 2.09* 2.2* 2.7*    Recent Results (from the past 240 hour(s))  Culture, blood (Routine x 2)     Status: None (Preliminary result)   Collection Time: 03/20/17  7:09 PM  Result Value Ref Range Status   Specimen Description RIGHT ANTECUBITAL  Final   Special Requests   Final    BOTTLES DRAWN AEROBIC ONLY Blood Culture adequate volume   Culture NO GROWTH 4 DAYS  Final   Report Status PENDING  Incomplete  Culture, blood (Routine x 2)     Status: None (Preliminary result)   Collection Time: 03/20/17  7:09 PM  Result Value Ref Range Status   Specimen Description BLOOD LEFT HAND  Final   Special Requests   Final    BOTTLES DRAWN AEROBIC ONLY Blood Culture adequate volume   Culture NO GROWTH 4 DAYS  Final   Report Status PENDING  Incomplete  MRSA PCR Screening     Status: None   Collection Time: 03/21/17  1:27 AM  Result Value Ref Range Status   MRSA by PCR NEGATIVE NEGATIVE Final    Comment:        The GeneXpert MRSA Assay (FDA approved for NASAL specimens only), is one component of a comprehensive MRSA colonization surveillance program. It is not intended to diagnose MRSA infection nor to guide or monitor treatment for MRSA infections.          Radiology Studies: US Abdomen Limited Ruq  Result Date: 03/24/2017 CLINICAL DATA:  77 year old male with elevated LFTs. Subsequent encounter. EXAM: ULTRASOUND ABDOMEN LIMITED RIGHT UPPER QUADRANT COMPARISON:  03/22/2017 renal sonogram. FINDINGS: Gallbladder: 1.3 cm mobile gallstone. At points gallbladder wall appears thickened measuring up to 5.6 mm. Per ultrasound technologist, patient was not tender over this region during scanning. Common bile duct: Diameter: 4.9 mm Liver: Heterogeneous with minimally lobulated contour raising possibility of cirrhosis with peri hepatic  ascites. No dominant liver mass. Portal vein is patent on color Doppler imaging with normal direction of blood flow towards the liver. IMPRESSION: Heterogeneous liver with minimally lobulated contour raising possibility of cirrhosis with peri hepatic ascites. No dominant liver mass. 1.3 cm mobile gallstone. At points gallbladder wall appears thickened measuring up to 5.6 mm. However, per ultrasound technologist, patient was not tender over this region during scanning. These results will be called to the ordering clinician or representative by the Radiologist Assistant, and communication documented in the PACS or zVision Dashboard. Electronically Signed   By: Genia Del M.D.   On: 03/24/2017 10:03        Scheduled Meds: . apixaban  5 mg Oral BID  . feeding supplement (ENSURE ENLIVE)  237 mL Oral BID BM  . guaiFENesin  1,200 mg Oral BID  . HYDROcodone-acetaminophen  1 tablet Oral Q6H  . ipratropium-albuterol  3 mL Nebulization TID  . mouth rinse  15 mL Mouth Rinse BID  . metoprolol tartrate  12.5 mg Oral BID  . polyethylene glycol  17 g Oral Daily  . [START ON 03/25/2017] predniSONE  30 mg Oral Q breakfast  . sodium chloride flush  3 mL Intravenous Q12H   Continuous Infusions: . sodium chloride    . azithromycin Stopped (03/23/17 2213)  . cefTRIAXone (ROCEPHIN)  IV Stopped (03/23/17 2048)     LOS: 4 days    Time spent: 34mins    Kathie Dike, MD [facility] Pager [phone]  If 7PM-7AM, please contact night-coverage [email or web address] Password [facility] 03/24/2017, 5:21 PM

## 2017-03-24 NOTE — Plan of Care (Signed)
  Acute Rehab PT Goals(only PT should resolve) Pt Will Go Supine/Side To Sit 03/24/2017 1601 - Progressing by Lonell Grandchild, PT Flowsheets Taken 03/24/2017 1601  Pt will go Supine/Side to Sit with min guard assist Patient Will Transfer Sit To/From Stand 03/24/2017 1601 - Progressing by Lonell Grandchild, PT Flowsheets Taken 03/24/2017 1601  Patient will transfer sit to/from stand with min guard assist Pt Will Transfer Bed To Chair/Chair To Bed 03/24/2017 1601 - Progressing by Lonell Grandchild, PT Flowsheets Taken 03/24/2017 1601  Pt will Transfer Bed to Chair/Chair to Bed min guard assist Pt Will Ambulate 03/24/2017 1601 - Progressing by Lonell Grandchild, PT Flowsheets Taken 03/24/2017 1601  Pt will Ambulate 25 feet;with minimal assist;with rolling walker  4:01 PM, 03/24/17 Lonell Grandchild, MPT Physical Therapist with [facility] [phone] office [phone] mobile phone

## 2017-03-25 LAB — CULTURE, BLOOD (ROUTINE X 2)
CULTURE: NO GROWTH
Culture: NO GROWTH
SPECIAL REQUESTS: ADEQUATE
Special Requests: ADEQUATE

## 2017-03-25 LAB — COMPREHENSIVE METABOLIC PANEL
ALK PHOS: 64 U/L (ref 38–126)
ALT: 260 U/L — ABNORMAL HIGH (ref 17–63)
ANION GAP: 11 (ref 5–15)
AST: 109 U/L — ABNORMAL HIGH (ref 15–41)
Albumin: 3.1 g/dL — ABNORMAL LOW (ref 3.5–5.0)
BUN: 52 mg/dL — ABNORMAL HIGH (ref 6–20)
CALCIUM: 8.9 mg/dL (ref 8.9–10.3)
CO2: 25 mmol/L (ref 22–32)
Chloride: 102 mmol/L (ref 101–111)
Creatinine, Ser: 0.8 mg/dL (ref 0.61–1.24)
Glucose, Bld: 106 mg/dL — ABNORMAL HIGH (ref 65–99)
Potassium: 4.3 mmol/L (ref 3.5–5.1)
SODIUM: 138 mmol/L (ref 135–145)
Total Bilirubin: 2.3 mg/dL — ABNORMAL HIGH (ref 0.3–1.2)
Total Protein: 5.8 g/dL — ABNORMAL LOW (ref 6.5–8.1)

## 2017-03-25 MED ORDER — BISACODYL 10 MG RE SUPP
10.0000 mg | Freq: Once | RECTAL | Status: AC
  Administered 2017-03-25: 10 mg via RECTAL
  Filled 2017-03-25: qty 1

## 2017-03-25 MED ORDER — FUROSEMIDE 40 MG PO TABS
40.0000 mg | ORAL_TABLET | Freq: Every day | ORAL | Status: DC
  Administered 2017-03-25 – 2017-03-27 (×3): 40 mg via ORAL
  Filled 2017-03-25 (×3): qty 1

## 2017-03-25 MED ORDER — AMOXICILLIN-POT CLAVULANATE 875-125 MG PO TABS
1.0000 | ORAL_TABLET | Freq: Two times a day (BID) | ORAL | Status: AC
  Administered 2017-03-25 – 2017-03-27 (×6): 1 via ORAL
  Filled 2017-03-25 (×6): qty 1

## 2017-03-25 MED ORDER — MILK AND MOLASSES ENEMA: 1.0000 | Freq: Once | RECTAL | Status: AC

## 2017-03-25 NOTE — Progress Notes (Signed)
Called report to 300 RN who will be getting patient. Taking to 300 by wheel chair.

## 2017-03-25 NOTE — Care Management Note (Signed)
Case Management Note  Patient Details  Name: Brett Carey MRN: [id number] Date of Birth: Jul 01, 1940   If discussed at Long Length of Stay Meetings, dates discussed:  03/25/2017  Additional Comments:  [name], Chauncey Reading, RN 03/25/2017, 12:06 PM

## 2017-03-25 NOTE — Clinical Social Work Note (Signed)
Clinical Social Work Assessment  Patient Details  Name: Brett Carey MRN: [id number] Date of Birth: 07-20-1940  Date of referral:  03/25/17               Reason for consult:  Facility Placement                Permission sought to share information with:    Permission granted to share information::     Name::        Agency::     Relationship::     Contact Information:     Housing/Transportation Living arrangements for the past 2 months:  Single Family Home Source of Information:  Adult Children Patient Interpreter Needed:  None Criminal Activity/Legal Involvement Pertinent to Current Situation/Hospitalization:  No - Comment as needed Significant Relationships:  Adult Children, Spouse Lives with:  Spouse Do you feel safe going back to the place where you live?  Yes Need for family participation in patient care:  Yes (Comment)  Care giving concerns: Pt's daughter states that pt normally cares for his wife who has dementia. Daughter agrees that pt needs SNF rehab. Family helping watch pt's wife while he recovers.   Social Worker assessment / plan: Pt is a 77 year old male referred to CSW for SNF rehab placement. Met with pt (sleeping) and pt's daughter this AM to assess. Pt resides in a single family home in [place] with his wife who has dementia. Pt had been independent in ADLs at home prior to admission. He has a ramped entrance to the home. Pt is alert and oriented x3 at baseline. Pt's daughter plans to visit a couple SNFs and talk to her daughter and then will let LCSW know where they would like referrals. She is leaning towards [facility] as first choice. Will make referrals as soon as daughter calls to inform of choices. Pt has [employer] and will need authorization from [place] before he can discharge.  Employment status:  Retired Nurse, adult PT Recommendations:  [place] / Referral to community resources:      Patient/Family's Response to care: Pt's daughter very receptive to [employer] Unable to assess pt as he was sleeping.  Patient/Family's Understanding of and Emotional Response to Diagnosis, Current Treatment, and Prognosis: Pt's daughter understands diagnosis and treatment recommendations. She is very supportive of her father and does not appear to have any emotional distress. Unable to assess pt as he was sleeping.  Emotional Assessment Appearance:  Appears stated age Attitude/Demeanor/Rapport:    Affect (typically observed):  Unable to Assess Orientation:  Oriented to Self, Oriented to Place, Oriented to Situation, Oriented to  Time Alcohol / Substance use:  Not Applicable Psych involvement (Current and /or in the community):  No (Comment)  Discharge Needs  Concerns to be addressed:  Discharge Planning Concerns Readmission within the last 30 days:    Current discharge risk:  Dependent with Mobility Barriers to Discharge:  No Barriers Identified   Shade Flood, LCSW 03/25/2017, 9:57 AM

## 2017-03-25 NOTE — Evaluation (Signed)
Clinical/Bedside Swallow Evaluation Patient Details  Name: Brett Carey MRN: [id number] Date of Birth: [birth year]  Today's Date: 03/25/2017 Time: SLP Start Time (ACUTE ONLY): 1300 SLP Stop Time (ACUTE ONLY): 1327 SLP Time Calculation (min) (ACUTE ONLY): 27 min  Past Medical History:  Past Medical History:  Diagnosis Date  . Atrial fibrillation with RVR ([place]) 01/04/2014  . CHF (congestive heart failure) ([place])   . COPD (chronic obstructive pulmonary disease) ([place])   . Gout   . History of hiatal hernia   . Hypertension   . Pneumonia X 2   Past Surgical History:  Past Surgical History:  Procedure Laterality Date  . BACK SURGERY    . CATARACT EXTRACTION W/ INTRAOCULAR LENS  IMPLANT, BILATERAL Bilateral 2000's  . LUMBAR DISC SURGERY     HPI:  77 year old male with a history of COPD, atrial fibrillation, presented to the hospital with progressive shortness of breath.  Found to have COPD exacerbation as well as community-acquired pneumonia.  He had respiratory failure requiring BiPAP therapy, now on nasal cannula.  He has been treated with steroids, nebs and antibiotics. Hospital course complicated by development of AKI due to hypotension, which has since improved. He is slowly improving. Swallow evaluation pending for dysphagia. Will need SNF placement on discharge. Anticipate discharge in the next 1-2 days.   Assessment / Plan / Recommendation Clinical Impression  Pt without overt signs or symptoms of aspiration at bedside, however intake was limited. Pt denies difficulty swallowing, however his daughter reports that Pt has been stating that "things won't go down". Recommend D3/mech soft and thin liquids due to edentulous status and SLP to follow up in AM with likely MBSS given reports of dysphagia with decreased po intake for several weeks. Continue PO meds whole in puree.   SLP Visit Diagnosis: Dysphagia, unspecified (R13.10)    Aspiration Risk  Mild aspiration risk    Diet  Recommendation Dysphagia 3 (Mech soft);Thin liquid   Liquid Administration via: Cup;Straw Medication Administration: Whole meds with puree Supervision: Staff to assist with self feeding Compensations: Slow rate;Small sips/bites Postural Changes: Seated upright at 90 degrees;Remain upright for at least 30 minutes after po intake    Other  Recommendations Oral Care Recommendations: Oral care BID;Staff/trained caregiver to provide oral care Other Recommendations: Clarify dietary restrictions   Follow up Recommendations None      Frequency and Duration min 2x/week  1 week       Prognosis Prognosis for Safe Diet Advancement: Good      Swallow Study   General Date of Onset: 03/20/17 HPI: 77 year old male with a history of COPD, atrial fibrillation, presented to the hospital with progressive shortness of breath.  Found to have COPD exacerbation as well as community-acquired pneumonia.  He had respiratory failure requiring BiPAP therapy, now on nasal cannula.  He has been treated with steroids, nebs and antibiotics. Hospital course complicated by development of AKI due to hypotension, which has since improved. He is slowly improving. Swallow evaluation pending for dysphagia. Will need SNF placement on discharge. Anticipate discharge in the next 1-2 days. Type of Study: Bedside Swallow Evaluation Diet Prior to this Study: Regular;Thin liquids Temperature Spikes Noted: No Respiratory Status: Room air History of Recent Intubation: No Behavior/Cognition: Alert;Cooperative;Pleasant mood Oral Cavity Assessment: Within Functional Limits Oral Care Completed by SLP: Yes Oral Cavity - Dentition: Edentulous Vision: Functional for self-feeding Self-Feeding Abilities: Needs assist Patient Positioning: Upright in bed Baseline Vocal Quality: Normal;Low vocal intensity Volitional Cough: Weak Volitional Swallow: Able to elicit  Oral/Motor/Sensory Function Overall Oral Motor/Sensory Function:  Within functional limits   Ice Chips Ice chips: Within functional limits Presentation: Spoon   Thin Liquid Thin Liquid: Within functional limits Presentation: Cup;Self Fed;Straw    Nectar Thick Nectar Thick Liquid: Not tested   Honey Thick Honey Thick Liquid: Not tested   Puree Puree: Within functional limits Presentation: Spoon   Solid   Thank you,  Genene Churn, CCC-SLP [phone]    Solid: Within functional limits Presentation: Self Fed Other Comments: Pt edentulous        Kayson Bullis 03/25/2017,3:47 PM

## 2017-03-25 NOTE — Progress Notes (Signed)
Molasses and milk enema ordered this AM, writer contacted dietary x3 for molasses since it was not on the units. Writer went to dietary at 1430 who then informed writer that there was no molasses and they would not have it until after 3pm. Writer contacted Dr via pager. One time ducolax ordered supp and given with positive result. Patient had small, soft, formed brown stool.

## 2017-03-25 NOTE — Progress Notes (Signed)
PROGRESS NOTE    Brett Carey  IRJ:[id number] DOB: 10-Jul-1940 DOA: 03/20/2017 PCP: Celene Squibb, MD    Brief Narrative:  77 year old male with a history of COPD, atrial fibrillation, presented to the hospital with progressive shortness of breath.  Found to have COPD exacerbation as well as community-acquired pneumonia.  He had respiratory failure requiring BiPAP therapy, now on nasal cannula.  He has been treated with steroids, nebs and antibiotics. Hospital course complicated by development of AKI due to hypotension, which has since improved. He is slowly improving. Swallow evaluation pending for dysphagia. Will need SNF placement on discharge. Anticipate discharge in the next 1-2 days.   Assessment & Plan:   Principal Problem:   PNA (pneumonia) Active Problems:   Atrial fibrillation (HCC)   Benign essential HTN   Acute respiratory failure with hypoxia (HCC)   Community acquired pneumonia of right middle lobe of lung (HCC)   COPD (chronic obstructive pulmonary disease) (HCC)   CHF (congestive heart failure) (HCC)   Malnutrition of moderate degree   1. Acute respiratory failure with hypoxia.  Related to pneumonia and COPD exacerbation.  Briefly required BiPAP, now on nasal cannula.  Continue to wean off oxygen as tolerated. Patient does describe some element of dysphagia which could be contributing to cough and wheezing. Will request swallow evaluation 2. Community-acquired pneumonia.  Currently on Rocephin and azithromycin.  Continue pulmonary hygiene. 3. COPD exacerbation.  Wheezing has improved with treatment.  Continue on prednisone. Continue neb treatments 4. Chronic diastolic congestive heart failure.  Patient does have some lower extremity edema, but this is reported to be chronic and better than baseline at this time. Will restart on low dose lasix since he takes this at home. 5. Chronic atrial fibrillation.  Heart rates have been stable.  Continue on low dose metoprolol and  titrate up as blood pressure will tolerate.  Anticoagulated with apixaban. 6. AKI. Creatinine had bumped to 1.8.  Suspect this is related to hypotension leading to ATN.  Patient started on gentle hydration.  Overall urine output has been improving.  Blood pressure is better today.  Renal ultrasound is unremarkable.  Creatinine has since improved to 0.8 7. Elevated liver enzymes.  No abdominal pain, nausea or vomiting.  Suspect this is reactive to hypotension.  Transaminases are improving with hydration.  Abdominal ultrasound showed nonspecific gallbladder wall thickening with stone, but clinically patient does not have any abdominal tenderness, nausea or vomiting.  Continue to trend 8. Cirrhosis.  Likely related to previous history of alcohol abuse. New diagnosis for the patient. Follow-up for further workup as an outpatient.   DVT prophylaxis: Apixaban Code Status: Partial code, no intubation or chest compressions Family Communication: Discussed with daughter at the bedside Disposition Plan: We will likely need skilled nursing facility placement on discharge   Consultants:     Procedures:     Antimicrobials:  Ceftriaxone 1/24 >  Azithromycin 1/24 >   Subjective: Feeling better. Developing some productive cough. Reports some trouble swallowing. Has not had a bowel movement in several days  Objective: Vitals:   03/25/17 0700 03/25/17 0705 03/25/17 0800 03/25/17 0840  BP: 99/80  114/83   Pulse: 90 85 80   Resp: 17 19 17    Temp:  97.6 F (36.4 C)    TempSrc:  Axillary    SpO2: (!) 88% (!) 87%  93%  Weight:      Height:        Intake/Output Summary (Last 24 hours) at 03/25/2017 0847  Last data filed at 03/25/2017 0800 Gross per 24 hour  Intake 363 ml  Output 875 ml  Net -512 ml   Filed Weights   03/23/17 0500 03/24/17 0500 03/25/17 0500  Weight: 98.2 kg (216 lb 7.9 oz) 101.7 kg (224 lb 3.3 oz) 102.2 kg (225 lb 5 oz)    Examination:  General exam: Appears calm and  comfortable  Respiratory system: mild wheeze bilaterally with rhonchi.  Bilateral lower lobe crackles.  Respiratory effort normal. Cardiovascular system: S1 & S2 heard, irregular.  No JVD, murmurs, rubs, gallops or clicks. 1-2+ pedal edema. Gastrointestinal system: Abdomen is nondistended, soft and nontender. No organomegaly or masses felt. Normal bowel sounds heard. Central nervous system: Alert and oriented. No focal neurological deficits. Extremities: Symmetric 3-4/5. Skin: venous stasis changes in LE bilaterally Psychiatry: Judgement and insight appear normal. Mood & affect appropriate.     Data Reviewed: I have personally reviewed following labs and imaging studies  CBC: Recent Labs  Lab 03/20/17 1908 03/21/17 0300 03/22/17 0421 03/23/17 0821  WBC 11.2* 11.6* 14.8* 16.2*  NEUTROABS 9.3* 10.4*  --   --   HGB 16.0 16.5 16.2 16.3  HCT 50.0 50.8 50.4 50.6  MCV 98.0 97.1 96.2 96.7  PLT 163 145* 143* 595*   Basic Metabolic Panel: Recent Labs  Lab 03/21/17 0300 03/22/17 0421 03/23/17 0821 03/24/17 0453 03/25/17 0407  NA 140 137 136 137 138  K 3.7 4.1 4.7 4.5 4.3  CL 102 99* 101 102 102  CO2 22 20* 20* 22 25  GLUCOSE 144* 159* 90 107* 106*  BUN 29* 52* 73* 67* 52*  CREATININE 0.92 1.85* 1.86* 1.11 0.80  CALCIUM 9.0 8.4* 7.7* 8.3* 8.9   GFR: Estimated Creatinine Clearance: 86.2 mL/min (by C-G formula based on SCr of 0.8 mg/dL). Liver Function Tests: Recent Labs  Lab 03/20/17 1908 03/23/17 0821 03/24/17 0453 03/25/17 0407  AST 27 434* 183* 109*  ALT 17 422* 320* 260*  ALKPHOS 60 63 62 64  BILITOT 3.3* 2.2* 2.1* 2.3*  PROT 6.7 6.2* 5.8* 5.8*  ALBUMIN 3.5 3.2* 3.0* 3.1*   Recent Labs  Lab 03/20/17 1909  LIPASE 25   No results for input(s): AMMONIA in the last 168 hours. Coagulation Profile: Recent Labs  Lab 03/20/17 1908  INR 1.39   Cardiac Enzymes: No results for input(s): CKTOTAL, CKMB, CKMBINDEX, TROPONINI in the last 168 hours. BNP (last 3  results) No results for input(s): PROBNP in the last 8760 hours. HbA1C: No results for input(s): HGBA1C in the last 72 hours. CBG: No results for input(s): GLUCAP in the last 168 hours. Lipid Profile: No results for input(s): CHOL, HDL, LDLCALC, TRIG, CHOLHDL, LDLDIRECT in the last 72 hours. Thyroid Function Tests: No results for input(s): TSH, T4TOTAL, FREET4, T3FREE, THYROIDAB in the last 72 hours. Anemia Panel: No results for input(s): VITAMINB12, FOLATE, FERRITIN, TIBC, IRON, RETICCTPCT in the last 72 hours. Sepsis Labs: Recent Labs  Lab 03/20/17 1927 03/20/17 2329 03/21/17 0300  LATICACIDVEN 2.09* 2.2* 2.7*    Recent Results (from the past 240 hour(s))  Culture, blood (Routine x 2)     Status: None (Preliminary result)   Collection Time: 03/20/17  7:09 PM  Result Value Ref Range Status   Specimen Description RIGHT ANTECUBITAL  Final   Special Requests   Final    BOTTLES DRAWN AEROBIC ONLY Blood Culture adequate volume   Culture NO GROWTH 4 DAYS  Final   Report Status PENDING  Incomplete  Culture, blood (Routine  x 2)     Status: None (Preliminary result)   Collection Time: 03/20/17  7:09 PM  Result Value Ref Range Status   Specimen Description BLOOD LEFT HAND  Final   Special Requests   Final    BOTTLES DRAWN AEROBIC ONLY Blood Culture adequate volume   Culture NO GROWTH 4 DAYS  Final   Report Status PENDING  Incomplete  MRSA PCR Screening     Status: None   Collection Time: 03/21/17  1:27 AM  Result Value Ref Range Status   MRSA by PCR NEGATIVE NEGATIVE Final    Comment:        The GeneXpert MRSA Assay (FDA approved for NASAL specimens only), is one component of a comprehensive MRSA colonization surveillance program. It is not intended to diagnose MRSA infection nor to guide or monitor treatment for MRSA infections.          Radiology Studies: US Abdomen Limited Ruq  Result Date: 03/24/2017 CLINICAL DATA:  77 year old male with elevated LFTs.  Subsequent encounter. EXAM: ULTRASOUND ABDOMEN LIMITED RIGHT UPPER QUADRANT COMPARISON:  03/22/2017 renal sonogram. FINDINGS: Gallbladder: 1.3 cm mobile gallstone. At points gallbladder wall appears thickened measuring up to 5.6 mm. Per ultrasound technologist, patient was not tender over this region during scanning. Common bile duct: Diameter: 4.9 mm Liver: Heterogeneous with minimally lobulated contour raising possibility of cirrhosis with peri hepatic ascites. No dominant liver mass. Portal vein is patent on color Doppler imaging with normal direction of blood flow towards the liver. IMPRESSION: Heterogeneous liver with minimally lobulated contour raising possibility of cirrhosis with peri hepatic ascites. No dominant liver mass. 1.3 cm mobile gallstone. At points gallbladder wall appears thickened measuring up to 5.6 mm. However, per ultrasound technologist, patient was not tender over this region during scanning. These results will be called to the ordering clinician or representative by the Radiologist Assistant, and communication documented in the PACS or zVision Dashboard. Electronically Signed   By: Genia Del M.D.   On: 03/24/2017 10:03        Scheduled Meds: . apixaban  5 mg Oral BID  . feeding supplement (ENSURE ENLIVE)  237 mL Oral BID BM  . furosemide  40 mg Oral Daily  . guaiFENesin  1,200 mg Oral BID  . HYDROcodone-acetaminophen  1 tablet Oral Q6H  . ipratropium-albuterol  3 mL Nebulization TID  . mouth rinse  15 mL Mouth Rinse BID  . metoprolol tartrate  12.5 mg Oral BID  . polyethylene glycol  17 g Oral Daily  . predniSONE  30 mg Oral Q breakfast  . sodium chloride flush  3 mL Intravenous Q12H   Continuous Infusions: . sodium chloride    . azithromycin Stopped (03/24/17 2118)  . cefTRIAXone (ROCEPHIN)  IV Stopped (03/24/17 2102)     LOS: 5 days    Time spent: 24mins    Kathie Dike, MD [facility] Pager [phone]  If 7PM-7AM, please contact  night-coverage [email or web address] Password [facility] 03/25/2017, 8:47 AM

## 2017-03-25 NOTE — Progress Notes (Signed)
Physical Therapy Note  Patient Details  Name: Primitivo Merkey MRN: [id number] Date of Birth: 1940/11/29 Today's Date: 03/25/2017   Pt just moved from ICU into 329.  Pt sleeping with spouse present.  Spouse asked therapy be held today as he is tired from the move and has just had an enema.  Explained activity usually helps the effectiveness of the enema, however spouse still requests therapy be held today.    Teena Irani, PTA/CLT [phone] 03/25/2017  16:36

## 2017-03-25 NOTE — NC FL2 (Signed)
[place] MEDICAID FL2 LEVEL OF CARE SCREENING TOOL     IDENTIFICATION  Patient Name: Brett Carey Birthdate: 07-15-1940 Sex: male Admission Date (Current Location): 03/20/2017  [facility] and [place] Number:  [employer] and Address:  [place] [address], [place]      Provider Number: [id number]  Attending Physician Name and Address:  Kathie Dike, MD  Relative Name and Phone Number:  Buzzy Han (Daughter) [phone]    Current Level of Care: Hospital Recommended Level of Care: [employer] Prior Approval Number:    Date Approved/Denied:   PASRR Number: [id number]  Discharge Plan: SNF    Current Diagnoses: Patient Active Problem List   Diagnosis Date Noted  . Malnutrition of moderate degree 03/21/2017  . PNA (pneumonia) 03/20/2017  . Acute respiratory failure with hypoxia ([place]) 03/20/2017  . Community acquired pneumonia of right middle lobe of lung ([place])   . COPD (chronic obstructive pulmonary disease) ([place])   . CHF (congestive heart failure) ([place])   . Atrial fibrillation ([place]) 01/04/2014  . Syncope 01/04/2014  . Facial hematoma 01/04/2014  . Benign essential HTN 01/04/2014    Orientation RESPIRATION BLADDER Height & Weight     Self, Time, Situation, Place  O2 Continent Weight: 225 lb 5 oz (102.2 kg) Height:  5\' 4"  (162.6 cm)  BEHAVIORAL SYMPTOMS/MOOD NEUROLOGICAL BOWEL NUTRITION STATUS      Continent    AMBULATORY STATUS COMMUNICATION OF NEEDS Skin   Extensive Assist Verbally Normal                       Personal Care Assistance Level of Assistance  Bathing, Feeding, Dressing Bathing Assistance: Maximum assistance Feeding assistance: Limited assistance Dressing Assistance: Maximum assistance     Functional Limitations Info  Sight, Hearing, Speech Sight Info: Adequate Hearing Info: Adequate Speech Info: Adequate    SPECIAL CARE FACTORS FREQUENCY  PT (By licensed PT)     PT  Frequency: 5 times week              Contractures Contractures Info: Not present    Additional Factors Info  Code Status Code Status Info: partial             Current Medications (03/25/2017):  This is the current hospital active medication list Current Facility-Administered Medications  Medication Dose Route Frequency Provider Last Rate Last Dose  . 0.9 %  sodium chloride infusion  250 mL Intravenous PRN Derrill Kay A, MD      . albuterol (PROVENTIL) (2.5 MG/3ML) 0.083% nebulizer solution 2.5 mg  2.5 mg Nebulization Q2H PRN Kathie Dike, MD   2.5 mg at 03/21/17 1420  . apixaban (ELIQUIS) tablet 5 mg  5 mg Oral BID Phillips Grout, MD   5 mg at 03/25/17 0946  . azithromycin (ZITHROMAX) 500 mg in dextrose 5 % 250 mL IVPB  500 mg Intravenous Q24H Phillips Grout, MD   Stopped at 03/24/17 2118  . cefTRIAXone (ROCEPHIN) 1 g in dextrose 5 % 50 mL IVPB  1 g Intravenous Q24H Phillips Grout, MD   Stopped at 03/24/17 2102  . feeding supplement (ENSURE ENLIVE) (ENSURE ENLIVE) liquid 237 mL  237 mL Oral BID BM Kathie Dike, MD   237 mL at 03/25/17 0952  . furosemide (LASIX) tablet 40 mg  40 mg Oral Daily Kathie Dike, MD   40 mg at 03/25/17 0946  . guaiFENesin (MUCINEX) 12 hr tablet 1,200 mg  1,200  mg Oral BID Kathie Dike, MD   1,200 mg at 03/25/17 0946  . HYDROcodone-acetaminophen (NORCO) 10-325 MG per tablet 1 tablet  1 tablet Oral Q6H Phillips Grout, MD   1 tablet at 03/25/17 0511  . ipratropium-albuterol (DUONEB) 0.5-2.5 (3) MG/3ML nebulizer solution 3 mL  3 mL Nebulization TID Kathie Dike, MD   3 mL at 03/25/17 0838  . LORazepam (ATIVAN) injection 0.5-1 mg  0.5-1 mg Intravenous Q8H PRN Kathie Dike, MD      . MEDLINE mouth rinse  15 mL Mouth Rinse BID Kathie Dike, MD   15 mL at 03/25/17 0947  . metoprolol tartrate (LOPRESSOR) tablet 12.5 mg  12.5 mg Oral BID Kathie Dike, MD   12.5 mg at 03/25/17 0946  . milk and molasses enema  1 enema Rectal Once Kathie Dike, MD      . polyethylene glycol (MIRALAX / GLYCOLAX) packet 17 g  17 g Oral Daily Kathie Dike, MD   17 g at 03/25/17 0946  . predniSONE (DELTASONE) tablet 30 mg  30 mg Oral Q breakfast Kathie Dike, MD   30 mg at 03/25/17 0809  . sodium chloride flush (NS) 0.9 % injection 3 mL  3 mL Intravenous Q12H Derrill Kay A, MD   3 mL at 03/25/17 0948  . sodium chloride flush (NS) 0.9 % injection 3 mL  3 mL Intravenous PRN Phillips Grout, MD         Discharge Medications: Please see discharge summary for a list of discharge medications.  Relevant Imaging Results:  Relevant Lab Results:   Additional Information SSN: [id number] LCSW

## 2017-03-25 NOTE — Clinical Social Work Note (Signed)
Following for SNF referrals. [facility] declined bed offer for pt. Spoke with pt's daughter who requested referral to [facility] in [place]. Referral sent. Daughter speaking with her daughter to decide on other options if [facility] declines. Will follow.

## 2017-03-26 ENCOUNTER — Inpatient Hospital Stay (HOSPITAL_COMMUNITY): Payer: Medicare HMO

## 2017-03-26 DIAGNOSIS — K7031 Alcoholic cirrhosis of liver with ascites: Secondary | ICD-10-CM

## 2017-03-26 DIAGNOSIS — I482 Chronic atrial fibrillation: Secondary | ICD-10-CM

## 2017-03-26 DIAGNOSIS — J9600 Acute respiratory failure, unspecified whether with hypoxia or hypercapnia: Secondary | ICD-10-CM

## 2017-03-26 DIAGNOSIS — J441 Chronic obstructive pulmonary disease with (acute) exacerbation: Secondary | ICD-10-CM

## 2017-03-26 DIAGNOSIS — R7989 Other specified abnormal findings of blood chemistry: Secondary | ICD-10-CM

## 2017-03-26 DIAGNOSIS — R945 Abnormal results of liver function studies: Secondary | ICD-10-CM

## 2017-03-26 DIAGNOSIS — J181 Lobar pneumonia, unspecified organism: Secondary | ICD-10-CM

## 2017-03-26 DIAGNOSIS — N179 Acute kidney failure, unspecified: Secondary | ICD-10-CM

## 2017-03-26 LAB — COMPREHENSIVE METABOLIC PANEL
ALK PHOS: 67 U/L (ref 38–126)
ALT: 235 U/L — AB (ref 17–63)
AST: 108 U/L — ABNORMAL HIGH (ref 15–41)
Albumin: 3.1 g/dL — ABNORMAL LOW (ref 3.5–5.0)
Anion gap: 10 (ref 5–15)
BUN: 45 mg/dL — ABNORMAL HIGH (ref 6–20)
CALCIUM: 9 mg/dL (ref 8.9–10.3)
CO2: 27 mmol/L (ref 22–32)
CREATININE: 0.72 mg/dL (ref 0.61–1.24)
Chloride: 100 mmol/L — ABNORMAL LOW (ref 101–111)
GFR calc non Af Amer: >60 mL/min (ref >60)
Glucose, Bld: 104 mg/dL — ABNORMAL HIGH (ref 65–99)
Potassium: 4.5 mmol/L (ref 3.5–5.1)
Sodium: 137 mmol/L (ref 135–145)
Total Bilirubin: 2.7 mg/dL — ABNORMAL HIGH (ref 0.3–1.2)
Total Protein: 5.8 g/dL — ABNORMAL LOW (ref 6.5–8.1)

## 2017-03-26 LAB — CBC
HCT: 50.6 % (ref 39.0–52.0)
Hemoglobin: 16 g/dL (ref 13.0–17.0)
MCH: 30.8 pg (ref 26.0–34.0)
MCHC: 31.6 g/dL (ref 30.0–36.0)
MCV: 97.3 fL (ref 78.0–100.0)
PLATELETS: 86 10*3/uL — AB (ref 150–400)
RBC: 5.2 MIL/uL (ref 4.22–5.81)
RDW: 16.8 % — AB (ref 11.5–15.5)
WBC: 13.8 10*3/uL — ABNORMAL HIGH (ref 4.0–10.5)

## 2017-03-26 NOTE — Progress Notes (Signed)
Physical Therapy Treatment Patient Details Name: Brett Carey MRN: [id number] DOB: 1941/01/30 Today's Date: 03/26/2017    History of Present Illness Brett Carey is a 77 y.o. male with medical history significant of COPD, CHF, A. fib on Eliquis, hypertension comes in with several days of worsening shortness of breath and cough.  Patient takes care of his wife who has dementia at home and was very reluctant and refusing to come to the hospital according to his daughter because of his concerns with his wife being at home alone.  His daughter arranged for mom to be taking care of and finally convinced him to come to the ED.  Daughter reports that he has been very short of breath since yesterday.  He has been coughing a lot.  Patient denies any fevers or chills.  He has been wheezing at home.  He is a smoker and he does have oxygen as needed at home.  He has not had any nausea vomiting or diarrhea.  He has not been recently hospitalized or on recent antibiotics.  Patient found to have pneumonia in the ED and referred for admission for pneumonia.  Per emergency room physician report patient was stable for MedSurg floor and was in no respiratory distress earlier.  On arrival to [facility] floor however he is in moderate respiratory distress with very diminished breath sounds.  He actually says that he is not short of breath when obviously he is.    PT Comments    Patient had difficulty completing exercises while seated at bedside due to weakness, limited to a few steps at bedside, tolerated standing for up to 3-4 minutes and completing 4-5 partial squats before having to sit and HR increased to 140's with exertion, but returned to [facility] after resting.  Patient tolerated sitting up in chair with his daughter supervising at bedside.  Patient will benefit from continued physical therapy in hospital and recommended venue below to increase strength, balance, endurance for safe ADLs and gait.   Follow Up  Recommendations  SNF     Equipment Recommendations  Other (comment)(shower chair)    Recommendations for Other Services       Precautions / Restrictions Precautions Precautions: Fall Restrictions Weight Bearing Restrictions: No    Mobility  Bed Mobility Overal bed mobility: Needs Assistance Bed Mobility: Supine to Sit     Supine to sit: Min assist;Mod assist     General bed mobility comments: labored movement, has to use bed rail  Transfers Overall transfer level: Needs assistance Equipment used: Rolling walker (2 wheeled) Transfers: Sit to/from [facility] Sit to Stand: Min assist Stand pivot transfers: Min assist;Mod assist       General transfer comment: slow labored movement with limited tolerance for standing  Ambulation/Gait Ambulation/Gait assistance: Mod assist Ambulation Distance (Feet): 4 Feet Assistive device: Rolling walker (2 wheeled) Gait Pattern/deviations: Decreased step length - right;Decreased step length - left;Decreased stride length   Gait velocity interpretation: Below normal speed for age/gender General Gait Details: limited to 6-7 steps at bedside, tolerated standing with RW for up to 3-4 minutes before having to sit due to fatigue/weakness   Stairs            Wheelchair Mobility    Modified Rankin (Stroke Patients Only)       Balance Overall balance assessment: Needs assistance Sitting-balance support: Feet supported;No upper extremity supported Sitting balance-Leahy Scale: Good     Standing balance support: Bilateral upper extremity supported;During functional activity Standing balance-Leahy Scale: Fair  Cognition Arousal/Alertness: Awake/alert Behavior During Therapy: WFL for tasks assessed/performed Overall Cognitive Status: Within Functional Limits for tasks assessed                                        Exercises General Exercises -  Lower Extremity Ankle Circles/Pumps: Seated;AROM;Strengthening;Both;5 reps Long Arc Quad: Seated;Strengthening;Both;5 reps;AAROM Hip Flexion/Marching: Seated;Strengthening;Both;5 reps    General Comments        Pertinent Vitals/Pain Pain Assessment: No/denies pain    Home Living                      Prior Function            PT Goals (current goals can now be found in the care plan section) Acute Rehab PT Goals Patient Stated Goal: return home PT Goal Formulation: With patient/family Time For Goal Achievement: 04/07/17 Potential to Achieve Goals: Good Progress towards PT goals: Progressing toward goals    Frequency    Min 3X/week      PT Plan Current plan remains appropriate    Co-evaluation              AM-PAC PT "6 Clicks" Daily Activity  Outcome Measure  Difficulty turning over in bed (including adjusting bedclothes, sheets and blankets)?: A Little Difficulty moving from lying on back to sitting on the side of the bed? : A Lot Difficulty sitting down on and standing up from a chair with arms (e.g., wheelchair, bedside commode, etc,.)?: A Lot Help needed moving to and from a bed to chair (including a wheelchair)?: A Lot Help needed walking in hospital room?: A Lot Help needed climbing 3-5 steps with a railing? : Total 6 Click Score: 12    End of Session   Activity Tolerance: Patient limited by fatigue;Patient tolerated treatment well Patient left: in chair;with call bell/phone within reach;with family/visitor present;with chair alarm set Nurse Communication: Mobility status;Other (comment)(RN aware patient up in chair) PT Visit Diagnosis: Unsteadiness on feet (R26.81);Other abnormalities of gait and mobility (R26.89);Muscle weakness (generalized) (M62.81)     Time: 7371-0626 PT Time Calculation (min) (ACUTE ONLY): 32 min  Charges:  $Therapeutic Activity: 23-37 mins                    G Codes:       2:22 PM, Apr 21, 2017 Brett Carey,  MPT Physical Therapist with [facility] 336 [phone] office [phone] mobile phone

## 2017-03-26 NOTE — Progress Notes (Signed)
  Speech Language Pathology Treatment: Dysphagia  Patient Details Name: Brett Carey MRN: [id number] DOB: 12-Apr-1940 Today's Date: 03/26/2017 Time: 0927-1000 SLP Time Calculation (min) (ACUTE ONLY): 33 min  Assessment / Plan / Recommendation Clinical Impression  Pt seen during breakfast meal this AM with mechanical soft textures and thin liquids. Tray was sitting in front of Pt upon arrival, however he had made no attempts to self feed. Pt consumed nearly 100% of eggs and sausage with SLP assist for feeding. He was able to hold his water in his hand and self feed. No overt signs or symptoms of aspiration except for one delayed cough towards the end of his meal. Pt with min oral scatter of solids and benefited from cues to follow with liquid wash (likely due to edentulous status). Pt denies discomfort or globus sensation with po consumption. Recommend continuing D3 and thin liquids due to edentulous status and decreased strength (he will also need feeder assist at this time).    HPI HPI: 77 year old male with a history of COPD, atrial fibrillation, presented to the hospital with progressive shortness of breath.  Found to have COPD exacerbation as well as community-acquired pneumonia.  He had respiratory failure requiring BiPAP therapy, now on nasal cannula.  He has been treated with steroids, nebs and antibiotics. Hospital course complicated by development of AKI due to hypotension, which has since improved. He is slowly improving. Swallow evaluation pending for dysphagia. Will need SNF placement on discharge. Anticipate discharge in the next 1-2 days.      SLP Plan  Continue with current plan of care       Recommendations  Diet recommendations: Dysphagia 3 (mechanical soft);Thin liquid Liquids provided via: Cup;Straw Medication Administration: Whole meds with puree Supervision: Staff to assist with self feeding;Full supervision/cueing for compensatory strategies Compensations: Slow  rate;Small sips/bites Postural Changes and/or Swallow Maneuvers: Seated upright 90 degrees;Upright 30-60 min after meal                Oral Care Recommendations: Oral care BID;Staff/trained caregiver to provide oral care Follow up Recommendations: Skilled Nursing facility Plan: Continue with current plan of care       Thank you,  Genene Churn, [place]                 [place] 03/26/2017, 10:38 AM

## 2017-03-26 NOTE — Progress Notes (Signed)
PROGRESS NOTE  Brett Carey [id number]:[id number] DOB: 08-02-1940 DOA: 03/20/2017 PCP: Celene Squibb, MD  Brief History:  77 year old male with a history of essential hypertension, COPD, atrial fibrillation presented with shortness of breath, dyspnea on exertion, and confusion.  At the time of admission, the patient was in rate controlled atrial fibrillation.  He was started on azithromycin and ceftriaxone.  Initial chest x-ray showed vascular congestion with moderate pleural effusion.  The patient was given furosemide 80 mg IV x1.  The patient was noted to be hypoxic requiring 3 L of oxygen.  Assessment/Plan: Acute respiratory failure with hypoxia -Secondary to pneumonia and COPD exacerbation -CT chest to clarify pleural effusions vs atelec vs consolidation  Community acquired pneumonia -Initially started on ceftriaxone and azithromycin  -changed to amox/clav on 1/29 -d/c abx after 03/27/17 doses  COPD exacerbation -Continue weaning steroids -stable on 2 L -50 pk years tobacco  Chronic Atrial Fibrillation -rate controlled -continue apixaban -continue metoprolol  AKI -due to infectious proceess -baseline creatinine 0.8-1.0 -serum creatinine peaked 1.86 -improved  Chronic diastolic CHF -check BNP -Echo -daily weights  Transaminasemia -due to hypotension -improving -1/28 RUQ US--heterogenous liver concerning for cirrhosis with perihepatic ascites; cholelithiasis with gallbladder wall thickening; no ductal dilatation  Liver Cirrhosis with ascites -noted on abdominal imaging -likely due Etoh -hep B surface antigen -hep c antibody -attempt paracetesis--check SAAG  Moderate malnutrition -continue supplements   Disposition Plan:   SNF in 1-2  days  Family Communication:   No Family at bedside  Consultants:  none  Code Status:  FULL   DVT Prophylaxis:  apixaban   Procedures: As Listed in Progress Note Above  Antibiotics: Ceftriaxone 1/24>>>1/28 azithro  1/24>>>1/28 Amox/clav 1/29>>>    Subjective: Patient complains of some shortness of breath and nonproductive cough.  He feels that his belly is low but distended.  Denies any fevers, chills, headache, chest pain, nausea, vomiting, diarrhea, abdominal pain.  Objective: Vitals:   03/26/17 0751 03/26/17 1051 03/26/17 1438 03/26/17 1443  BP:  101/82 106/85   Pulse:  (!) 119 (!) 106   Resp:      Temp:   97.8 F (36.6 C)   TempSrc:  Oral Oral   SpO2: 98% 95% 95% 96%  Weight:      Height:        Intake/Output Summary (Last 24 hours) at 03/26/2017 1602 Last data filed at 03/26/2017 1515 Gross per 24 hour  Intake 3 ml  Output 1225 ml  Net -1222 ml   Weight change: 0.6 kg (1 lb 5.2 oz) Exam:   General:  Pt is alert, follows commands appropriately, not in acute distress  HEENT: No icterus, No thrush, No neck mass, [place]/AT  Cardiovascular: RRR, S1/S2, no rubs, no gallops  Respiratory: Bibasilar crackles.  Bilateral rales, right greater than left.  No wheezing  Abdomen: Soft/+BS, non tender, non distended, no guarding; positive anasarca  Extremities:2+LE edema, No lymphangitis, No petechiae, No rashes, no synovitis   Data Reviewed: I have personally reviewed following labs and imaging studies Basic Metabolic Panel: Recent Labs  Lab 03/22/17 0421 03/23/17 0821 03/24/17 0453 03/25/17 0407 03/26/17 0455  NA 137 136 137 138 137  K 4.1 4.7 4.5 4.3 4.5  CL 99* 101 102 102 100*  CO2 20* 20* 22 25 27   GLUCOSE 159* 90 107* 106* 104*  BUN 52* 73* 67* 52* 45*  CREATININE 1.85* 1.86* 1.11 0.80 0.72  CALCIUM 8.4* 7.7* 8.3* 8.9  9.0   Liver Function Tests: Recent Labs  Lab 03/20/17 1908 03/23/17 0821 03/24/17 0453 03/25/17 0407 03/26/17 0455  AST 27 434* 183* 109* 108*  ALT 17 422* 320* 260* 235*  ALKPHOS 60 63 62 64 67  BILITOT 3.3* 2.2* 2.1* 2.3* 2.7*  PROT 6.7 6.2* 5.8* 5.8* 5.8*  ALBUMIN 3.5 3.2* 3.0* 3.1* 3.1*   Recent Labs  Lab 03/20/17 1909  LIPASE 25   No  results for input(s): AMMONIA in the last 168 hours. Coagulation Profile: Recent Labs  Lab 03/20/17 1908  INR 1.39   CBC: Recent Labs  Lab 03/20/17 1908 03/21/17 0300 03/22/17 0421 03/23/17 0821 03/26/17 0455  WBC 11.2* 11.6* 14.8* 16.2* 13.8*  NEUTROABS 9.3* 10.4*  --   --   --   HGB 16.0 16.5 16.2 16.3 16.0  HCT 50.0 50.8 50.4 50.6 50.6  MCV 98.0 97.1 96.2 96.7 97.3  PLT 163 145* 143* 125* 86*   Cardiac Enzymes: No results for input(s): CKTOTAL, CKMB, CKMBINDEX, TROPONINI in the last 168 hours. BNP: Invalid input(s): POCBNP CBG: No results for input(s): GLUCAP in the last 168 hours. HbA1C: No results for input(s): HGBA1C in the last 72 hours. Urine analysis:    Component Value Date/Time   COLORURINE AMBER (A) 03/20/2017 1900   APPEARANCEUR HAZY (A) 03/20/2017 1900   LABSPEC 1.025 03/20/2017 1900   PHURINE 5.0 03/20/2017 1900   GLUCOSEU NEGATIVE 03/20/2017 1900   HGBUR NEGATIVE 03/20/2017 1900   BILIRUBINUR SMALL (A) 03/20/2017 1900   KETONESUR NEGATIVE 03/20/2017 1900   PROTEINUR 100 (A) 03/20/2017 1900   NITRITE NEGATIVE 03/20/2017 1900   LEUKOCYTESUR NEGATIVE 03/20/2017 1900   Sepsis Labs: @LABRCNTIP (procalcitonin:4,lacticidven:4) ) Recent Results (from the past 240 hour(s))  Culture, blood (Routine x 2)     Status: None   Collection Time: 03/20/17  7:09 PM  Result Value Ref Range Status   Specimen Description RIGHT ANTECUBITAL  Final   Special Requests   Final    BOTTLES DRAWN AEROBIC ONLY Blood Culture adequate volume   Culture NO GROWTH 5 DAYS  Final   Report Status 03/25/2017 FINAL  Final  Culture, blood (Routine x 2)     Status: None   Collection Time: 03/20/17  7:09 PM  Result Value Ref Range Status   Specimen Description BLOOD LEFT HAND  Final   Special Requests   Final    BOTTLES DRAWN AEROBIC ONLY Blood Culture adequate volume   Culture NO GROWTH 5 DAYS  Final   Report Status 03/25/2017 FINAL  Final  MRSA PCR Screening     Status: None    Collection Time: 03/21/17  1:27 AM  Result Value Ref Range Status   MRSA by PCR NEGATIVE NEGATIVE Final    Comment:        The GeneXpert MRSA Assay (FDA approved for NASAL specimens only), is one component of a comprehensive MRSA colonization surveillance program. It is not intended to diagnose MRSA infection nor to guide or monitor treatment for MRSA infections.      Scheduled Meds: . amoxicillin-clavulanate  1 tablet Oral Q12H  . apixaban  5 mg Oral BID  . feeding supplement (ENSURE ENLIVE)  237 mL Oral BID BM  . furosemide  40 mg Oral Daily  . guaiFENesin  1,200 mg Oral BID  . HYDROcodone-acetaminophen  1 tablet Oral Q6H  . ipratropium-albuterol  3 mL Nebulization TID  . mouth rinse  15 mL Mouth Rinse BID  . metoprolol tartrate  12.5 mg Oral  BID  . polyethylene glycol  17 g Oral Daily  . predniSONE  30 mg Oral Q breakfast  . sodium chloride flush  3 mL Intravenous Q12H   Continuous Infusions: . sodium chloride      Procedures/Studies: Dg Chest 2 View  Result Date: 03/20/2017 CLINICAL DATA:  Generalize weakness with decreased appetite and short of breath EXAM: CHEST  2 VIEW COMPARISON:  01/04/2014 FINDINGS: Suspected moderate right pleural effusion. Cardiomegaly with vascular congestion and mild pulmonary edema. Aortic atherosclerosis. No pneumothorax. Mild degenerative changes of the spine. Airspace disease at the right middle lobe and lung base. IMPRESSION: 1. Cardiomegaly with vascular congestion and mild pulmonary edema. Suspected moderate right pleural effusion 2. Airspace disease at the right middle lobe and lung base could reflect superimposed pneumonia Electronically Signed   By: Donavan Foil M.D.   On: 03/20/2017 19:07   US Renal  Result Date: 03/22/2017 CLINICAL DATA:  Hypertension.  Acute kidney injury. EXAM: RENAL / URINARY TRACT ULTRASOUND COMPLETE COMPARISON:  None. FINDINGS: Right Kidney: Length: 10.9 cm. Mild cortical thinning. No hydronephrosis. 2 cm cyst  in the lower pole. Left Kidney: Length: 11.0 cm. Cortical thickness appears minimally thin end. Multiple cysts, the largest measuring 1.8 cm in the midportion. Bladder: Foley catheter in place. Some ascites is noted.  Probable cirrhosis of the liver. IMPRESSION: Kidneys are normal in size but show mild cortical thinning, right more than left. No hydronephrosis or significant focal lesion. Benign appearing simple cysts. Foley catheter in the bladder. Probable cirrhosis and ascites. Electronically Signed   By: Nelson Chimes M.D.   On: 03/22/2017 14:26   US Abdomen Limited Ruq  Result Date: 03/24/2017 CLINICAL DATA:  77 year old male with elevated LFTs. Subsequent encounter. EXAM: ULTRASOUND ABDOMEN LIMITED RIGHT UPPER QUADRANT COMPARISON:  03/22/2017 renal sonogram. FINDINGS: Gallbladder: 1.3 cm mobile gallstone. At points gallbladder wall appears thickened measuring up to 5.6 mm. Per ultrasound technologist, patient was not tender over this region during scanning. Common bile duct: Diameter: 4.9 mm Liver: Heterogeneous with minimally lobulated contour raising possibility of cirrhosis with peri hepatic ascites. No dominant liver mass. Portal vein is patent on color Doppler imaging with normal direction of blood flow towards the liver. IMPRESSION: Heterogeneous liver with minimally lobulated contour raising possibility of cirrhosis with peri hepatic ascites. No dominant liver mass. 1.3 cm mobile gallstone. At points gallbladder wall appears thickened measuring up to 5.6 mm. However, per ultrasound technologist, patient was not tender over this region during scanning. These results will be called to the ordering clinician or representative by the Radiologist Assistant, and communication documented in the PACS or zVision Dashboard. Electronically Signed   By: Genia Del M.D.   On: 03/24/2017 10:03    Orson Eva, DO  [facility] Pager [phone]  If 7PM-7AM, please contact  night-coverage [email or web address] Password [email or web address] 03/26/2017, 4:02 PM   LOS: 6 days

## 2017-03-26 NOTE — Clinical Social Work Note (Signed)
LCSW following for dc planning. Pt was declined at [facility]. He was offered a bed at [facility]. Discussed with pt's daughter who states that they will accept this bed offer.   Spoke with Sharyn Lull at [facility] to update. They are working on Ship broker. Pt will need to have auth in place before he can transfer. MD updated.   Will follow up tomorrow.

## 2017-03-27 ENCOUNTER — Inpatient Hospital Stay (HOSPITAL_COMMUNITY): Payer: Medicare HMO

## 2017-03-27 ENCOUNTER — Encounter: Admission: RE | Admit: 2017-03-27 | Payer: Medicare HMO | Source: Ambulatory Visit | Admitting: Internal Medicine

## 2017-03-27 DIAGNOSIS — I5033 Acute on chronic diastolic (congestive) heart failure: Secondary | ICD-10-CM

## 2017-03-27 DIAGNOSIS — J13 Pneumonia due to Streptococcus pneumoniae: Secondary | ICD-10-CM

## 2017-03-27 DIAGNOSIS — I361 Nonrheumatic tricuspid (valve) insufficiency: Secondary | ICD-10-CM

## 2017-03-27 DIAGNOSIS — J90 Pleural effusion, not elsewhere classified: Secondary | ICD-10-CM

## 2017-03-27 DIAGNOSIS — K7030 Alcoholic cirrhosis of liver without ascites: Secondary | ICD-10-CM

## 2017-03-27 LAB — COMPREHENSIVE METABOLIC PANEL
ALBUMIN: 3.2 g/dL — AB (ref 3.5–5.0)
ALT: 199 U/L — ABNORMAL HIGH (ref 17–63)
AST: 80 U/L — AB (ref 15–41)
Alkaline Phosphatase: 71 U/L (ref 38–126)
Anion gap: 11 (ref 5–15)
BUN: 44 mg/dL — AB (ref 6–20)
CHLORIDE: 97 mmol/L — AB (ref 101–111)
CO2: 29 mmol/L (ref 22–32)
Calcium: 9.4 mg/dL (ref 8.9–10.3)
Creatinine, Ser: 0.75 mg/dL (ref 0.61–1.24)
GFR calc Af Amer: >60 mL/min (ref >60)
GFR calc non Af Amer: >60 mL/min (ref >60)
GLUCOSE: 138 mg/dL — AB (ref 65–99)
POTASSIUM: 4.7 mmol/L (ref 3.5–5.1)
Sodium: 137 mmol/L (ref 135–145)
Total Bilirubin: 2.9 mg/dL — ABNORMAL HIGH (ref 0.3–1.2)
Total Protein: 6 g/dL — ABNORMAL LOW (ref 6.5–8.1)

## 2017-03-27 LAB — ECHOCARDIOGRAM COMPLETE
HEIGHTINCHES: 64 in
Weight: 3537.94 oz

## 2017-03-27 LAB — CBC
HEMATOCRIT: 51.4 % (ref 39.0–52.0)
Hemoglobin: 16.3 g/dL (ref 13.0–17.0)
MCH: 30.9 pg (ref 26.0–34.0)
MCHC: 31.7 g/dL (ref 30.0–36.0)
MCV: 97.5 fL (ref 78.0–100.0)
Platelets: 80 10*3/uL — ABNORMAL LOW (ref 150–400)
RBC: 5.27 MIL/uL (ref 4.22–5.81)
RDW: 16.6 % — AB (ref 11.5–15.5)
WBC: 13.5 10*3/uL — ABNORMAL HIGH (ref 4.0–10.5)

## 2017-03-27 LAB — BRAIN NATRIURETIC PEPTIDE: B Natriuretic Peptide: 518 pg/mL — ABNORMAL HIGH (ref 0.0–100.0)

## 2017-03-27 LAB — LACTATE DEHYDROGENASE: LDH: 245 U/L — ABNORMAL HIGH (ref 98–192)

## 2017-03-27 MED ORDER — DILTIAZEM HCL 30 MG PO TABS
30.0000 mg | ORAL_TABLET | Freq: Four times a day (QID) | ORAL | Status: DC
  Administered 2017-03-27 – 2017-03-29 (×10): 30 mg via ORAL
  Filled 2017-03-27 (×10): qty 1

## 2017-03-27 MED ORDER — FUROSEMIDE 10 MG/ML IJ SOLN
40.0000 mg | Freq: Every day | INTRAMUSCULAR | Status: DC
  Administered 2017-03-27 – 2017-03-29 (×3): 40 mg via INTRAVENOUS
  Filled 2017-03-27 (×3): qty 4

## 2017-03-27 MED ORDER — HYDROCODONE-ACETAMINOPHEN 10-325 MG PO TABS
1.0000 | ORAL_TABLET | Freq: Three times a day (TID) | ORAL | Status: DC
Start: 2017-03-27 — End: 2017-04-03
  Administered 2017-03-27 – 2017-04-02 (×18): 1 via ORAL
  Filled 2017-03-27 (×18): qty 1

## 2017-03-27 NOTE — Progress Notes (Signed)
PROGRESS NOTE  Ayomide Purdy JKD:[id number] DOB: [birth year] DOA: 03/20/2017 PCP: Celene Squibb, MD  Brief History:  77 year old male with a history of essential hypertension, COPD, atrial fibrillation presented with shortness of breath, dyspnea on exertion, and confusion.  At the time of admission, the patient was in rate controlled atrial fibrillation.  He was started on azithromycin and ceftriaxone.  Initial chest x-ray showed vascular congestion with moderate pleural effusion.  The patient was given furosemide 80 mg IV x1.  The patient was noted to be hypoxic requiring 3 L of oxygen.  Assessment/Plan: Acute respiratory failure with hypoxia -Secondary to pneumonia and COPD exacerbation -CT chest to clarify pleural effusions vs atelec vs consolidation  Community acquired pneumonia -Initially started on ceftriaxone and azithromycin  -changed to amox/clav on 1/29 -d/c abx after 03/27/17 doses  COPD exacerbation -Continue weaning steroids -stable on 2 L -50 pk years tobacco  Chronic Atrial Fibrillation -rate up to 120s today -continue apixaban -d/c metoprolol -start diltiazem  Pleural Effusion -03/26/17 CT chest--moderate emphysema.  Moderate partially loculated right pleural effusion.  Consolidation-RLL small perihepatic ascites;  -May be parapneumonic versus CHF -attempted thora--complex fluid in chest, insufficient to perform  AKI -due to infectious proceess -baseline creatinine 0.8-1.0 -serum creatinine peaked 1.86 -improved  Acute on Chronic diastolic CHF -check KDX--833 -Echo--EF 65-70%, no WMA, PASP 38, mild to moderate TR -daily weights--up 7 lbs -Start IV furosemide  Transaminasemia -due to hypotension -improving -1/28 RUQ US--heterogenous liver concerning for cirrhosis with perihepatic ascites; cholelithiasis with gallbladder wall thickening; no ductal dilatation -no abdominal pain  Liver Cirrhosis with ascites -noted on abdominal  imaging -likely due Etoh -hep B surface antigen -hep c antibody -attempted paracentesis-->not enough fluid  Moderate malnutrition -continue supplements   Disposition Plan:   SNF 2/2 or 2/3 Family Communication:   Daughter updated at bedside 1/31--Total time spent 35 minutes.  Greater than 50% spent face to face counseling and coordinating care.   Consultants:  none  Code Status:  FULL   DVT Prophylaxis:  apixaban   Procedures: As Listed in Progress Note Above  Antibiotics: Ceftriaxone 1/24>>>1/28 azithro 1/24>>>1/28 Amox/clav 1/29>>>      Subjective: Patient has intermittent shortness of breath.  Denies any chest pain, abdominal pain, nausea, vomiting, diarrhea, headache, neck pain.  There is no fever or chills.  Objective: Vitals:   03/27/17 0446 03/27/17 0734 03/27/17 0800 03/27/17 1018  BP: (!) 134/94  (!) 115/96 133/90  Pulse: (!) 109  (!) 53 (!) 118  Resp: 19   (!) 26  Temp:   (!) 95.7 F (35.4 C) (!) 97.5 F (36.4 C)  TempSrc:   Axillary Axillary  SpO2: 95% 97% 99% 98%  Weight: 100.3 kg (221 lb 1.9 oz)     Height:        Intake/Output Summary (Last 24 hours) at 03/27/2017 1440 Last data filed at 03/27/2017 0900 Gross per 24 hour  Intake 243 ml  Output 3200 ml  Net -2957 ml   Weight change: -2.5 kg (-8.2 oz) Exam:   General:  Pt is alert, follows commands appropriately, not in acute distress  HEENT: No icterus, No thrush, No neck mass, [place]/AT  Cardiovascular: RRR, S1/S2, no rubs, no gallops  Respiratory: Bilateral crackles.  Diminished breath sounds right base.  Abdomen: Soft/+BS, non tender, non distended, no guarding  Extremities: 2 + LE edema, No lymphangitis, No petechiae, No rashes, no synovitis   Data Reviewed: I have personally  reviewed following labs and imaging studies Basic Metabolic Panel: Recent Labs  Lab 03/23/17 0821 03/24/17 0453 03/25/17 0407 03/26/17 0455 03/27/17 0459  NA 136 137 138 137 137  K 4.7 4.5  4.3 4.5 4.7  CL 101 102 102 100* 97*  CO2 20* 22 25 27 29   GLUCOSE 90 107* 106* 104* 138*  BUN 73* 67* 52* 45* 44*  CREATININE 1.86* 1.11 0.80 0.72 0.75  CALCIUM 7.7* 8.3* 8.9 9.0 9.4   Liver Function Tests: Recent Labs  Lab 03/23/17 0821 03/24/17 0453 03/25/17 0407 03/26/17 0455 03/27/17 0459  AST 434* 183* 109* 108* 80*  ALT 422* 320* 260* 235* 199*  ALKPHOS 63 62 64 67 71  BILITOT 2.2* 2.1* 2.3* 2.7* 2.9*  PROT 6.2* 5.8* 5.8* 5.8* 6.0*  ALBUMIN 3.2* 3.0* 3.1* 3.1* 3.2*   Recent Labs  Lab 03/20/17 1909  LIPASE 25   No results for input(s): AMMONIA in the last 168 hours. Coagulation Profile: Recent Labs  Lab 03/20/17 1908  INR 1.39   CBC: Recent Labs  Lab 03/20/17 1908 03/21/17 0300 03/22/17 0421 03/23/17 0821 03/26/17 0455 03/27/17 0459  WBC 11.2* 11.6* 14.8* 16.2* 13.8* 13.5*  NEUTROABS 9.3* 10.4*  --   --   --   --   HGB 16.0 16.5 16.2 16.3 16.0 16.3  HCT 50.0 50.8 50.4 50.6 50.6 51.4  MCV 98.0 97.1 96.2 96.7 97.3 97.5  PLT 163 145* 143* 125* 86* 80*   Cardiac Enzymes: No results for input(s): CKTOTAL, CKMB, CKMBINDEX, TROPONINI in the last 168 hours. BNP: Invalid input(s): POCBNP CBG: No results for input(s): GLUCAP in the last 168 hours. HbA1C: No results for input(s): HGBA1C in the last 72 hours. Urine analysis:    Component Value Date/Time   COLORURINE AMBER (A) 03/20/2017 1900   APPEARANCEUR HAZY (A) 03/20/2017 1900   LABSPEC 1.025 03/20/2017 1900   PHURINE 5.0 03/20/2017 1900   GLUCOSEU NEGATIVE 03/20/2017 1900   HGBUR NEGATIVE 03/20/2017 1900   BILIRUBINUR SMALL (A) 03/20/2017 1900   KETONESUR NEGATIVE 03/20/2017 1900   PROTEINUR 100 (A) 03/20/2017 1900   NITRITE NEGATIVE 03/20/2017 1900   LEUKOCYTESUR NEGATIVE 03/20/2017 1900   Sepsis Labs: @LABRCNTIP (procalcitonin:4,lacticidven:4) ) Recent Results (from the past 240 hour(s))  Culture, blood (Routine x 2)     Status: None   Collection Time: 03/20/17  7:09 PM  Result Value Ref  Range Status   Specimen Description RIGHT ANTECUBITAL  Final   Special Requests   Final    BOTTLES DRAWN AEROBIC ONLY Blood Culture adequate volume   Culture NO GROWTH 5 DAYS  Final   Report Status 03/25/2017 FINAL  Final  Culture, blood (Routine x 2)     Status: None   Collection Time: 03/20/17  7:09 PM  Result Value Ref Range Status   Specimen Description BLOOD LEFT HAND  Final   Special Requests   Final    BOTTLES DRAWN AEROBIC ONLY Blood Culture adequate volume   Culture NO GROWTH 5 DAYS  Final   Report Status 03/25/2017 FINAL  Final  MRSA PCR Screening     Status: None   Collection Time: 03/21/17  1:27 AM  Result Value Ref Range Status   MRSA by PCR NEGATIVE NEGATIVE Final    Comment:        The GeneXpert MRSA Assay (FDA approved for NASAL specimens only), is one component of a comprehensive MRSA colonization surveillance program. It is not intended to diagnose MRSA infection nor to guide or monitor  treatment for MRSA infections.      Scheduled Meds: . amoxicillin-clavulanate  1 tablet Oral Q12H  . apixaban  5 mg Oral BID  . diltiazem  30 mg Oral Q6H  . feeding supplement (ENSURE ENLIVE)  237 mL Oral BID BM  . furosemide  40 mg Intravenous Daily  . guaiFENesin  1,200 mg Oral BID  . HYDROcodone-acetaminophen  1 tablet Oral Q8H  . ipratropium-albuterol  3 mL Nebulization TID  . mouth rinse  15 mL Mouth Rinse BID  . polyethylene glycol  17 g Oral Daily  . predniSONE  30 mg Oral Q breakfast  . sodium chloride flush  3 mL Intravenous Q12H   Continuous Infusions: . sodium chloride      Procedures/Studies: Dg Chest 2 View  Result Date: 03/20/2017 CLINICAL DATA:  Generalize weakness with decreased appetite and short of breath EXAM: CHEST  2 VIEW COMPARISON:  01/04/2014 FINDINGS: Suspected moderate right pleural effusion. Cardiomegaly with vascular congestion and mild pulmonary edema. Aortic atherosclerosis. No pneumothorax. Mild degenerative changes of the spine.  Airspace disease at the right middle lobe and lung base. IMPRESSION: 1. Cardiomegaly with vascular congestion and mild pulmonary edema. Suspected moderate right pleural effusion 2. Airspace disease at the right middle lobe and lung base could reflect superimposed pneumonia Electronically Signed   By: Donavan Foil M.D.   On: 03/20/2017 19:07   Ct Chest Wo Contrast  Result Date: 03/26/2017 CLINICAL DATA:  COPD and hypoxia.  Evaluate effusions. EXAM: CT CHEST WITHOUT CONTRAST TECHNIQUE: Multidetector CT imaging of the chest was performed following the standard protocol without IV contrast. COMPARISON:  Chest radiograph from 03/20/2017 FINDINGS: Cardiovascular: There is moderate to marked cardiac enlargement. No pericardial effusion. Aortic atherosclerosis. Calcifications are noted within the LAD Coronary artery. Mediastinum/Nodes: The trachea is patent and midline. Normal appearance of the esophagus. No mediastinal or hilar adenopathy. No axillary or supraclavicular adenopathy. Lungs/Pleura: Moderate changes of emphysema. Small left pleural effusion with overlying subpleural atelectasis. There is a moderate volume partially loculated right pleural effusion. There is compressive type atelectasis and consolidation within the right lower lobe. Complete atelectasis/consolidation of the right middle lobe identified. Upper Abdomen: There is a small volume of perihepatic ascites. Low-attenuation left adrenal nodule measures 2 cm and likely represents a benign adenoma. Musculoskeletal: There is degenerative disc disease noted within the thoracic spine. No aggressive lytic or sclerotic bone lesions. IMPRESSION: 1. Moderate partially loculated right pleural effusion with complete atelectasis/consolidation of the right middle lobe. Followup imaging is advised to ensure re-expansion of the right middle lobe. If this does not improve then consider further evaluation with either bronchoscopy or PET-CT to assess for underlying  lung lesion. 2. Small left pleural effusion. 3. Marked cardiac enlargement, aortic atherosclerosis and LAD coronary artery calcifications. Aortic Atherosclerosis (ICD10-I70.0). 4. Small volume of ascites within the right upper quadrant of the abdomen. 5. Diffuse bronchial wall thickening with emphysema, as above; imaging findings suggestive of underlying COPD. Emphysema (ICD10-J43.9). Electronically Signed   By: Kerby Moors M.D.   On: 03/26/2017 19:28   [place] Chest (pleural Effusion)  Result Date: 03/27/2017 CLINICAL DATA:  RIGHT pleural effusion, for thoracentesis EXAM: CHEST ULTRASOUND COMPARISON:  Chest CT 03/26/2017 FINDINGS: Sonography of the RIGHT hemithorax was performed. A thin rim of complicated fluid is seen surrounding the RIGHT lung base, containing scattered internal echoes. Volume of fluid visualized is insufficient for thoracentesis. IMPRESSION: Complex appearing fluid at the inferior RIGHT hemithorax, insufficient volume for thoracentesis. Electronically Signed   By: Elta Guadeloupe  Thornton Papas M.D.   On: 03/27/2017 12:51   US Renal  Result Date: 03/22/2017 CLINICAL DATA:  Hypertension.  Acute kidney injury. EXAM: RENAL / URINARY TRACT ULTRASOUND COMPLETE COMPARISON:  None. FINDINGS: Right Kidney: Length: 10.9 cm. Mild cortical thinning. No hydronephrosis. 2 cm cyst in the lower pole. Left Kidney: Length: 11.0 cm. Cortical thickness appears minimally thin end. Multiple cysts, the largest measuring 1.8 cm in the midportion. Bladder: Foley catheter in place. Some ascites is noted.  Probable cirrhosis of the liver. IMPRESSION: Kidneys are normal in size but show mild cortical thinning, right more than left. No hydronephrosis or significant focal lesion. Benign appearing simple cysts. Foley catheter in the bladder. Probable cirrhosis and ascites. Electronically Signed   By: Nelson Chimes M.D.   On: 03/22/2017 14:26   [place] Ascites (abdomen Limited)  Result Date: 03/27/2017 CLINICAL DATA:  Ascites, history atrial  fibrillation, CHF, COPD, hypertension EXAM: LIMITED ABDOMEN ULTRASOUND FOR ASCITES TECHNIQUE: Limited ultrasound survey for ascites was performed in all four abdominal quadrants. COMPARISON:  Abdomen ultrasound 03/24/2017, CT chest 03/26/2017 FINDINGS: No ascites is sonographically identified. IMPRESSION: No ascites identified. Electronically Signed   By: Lavonia Dana M.D.   On: 03/27/2017 09:34   US Abdomen Limited Ruq  Result Date: 03/24/2017 CLINICAL DATA:  77 year old male with elevated LFTs. Subsequent encounter. EXAM: ULTRASOUND ABDOMEN LIMITED RIGHT UPPER QUADRANT COMPARISON:  03/22/2017 renal sonogram. FINDINGS: Gallbladder: 1.3 cm mobile gallstone. At points gallbladder wall appears thickened measuring up to 5.6 mm. Per ultrasound technologist, patient was not tender over this region during scanning. Common bile duct: Diameter: 4.9 mm Liver: Heterogeneous with minimally lobulated contour raising possibility of cirrhosis with peri hepatic ascites. No dominant liver mass. Portal vein is patent on color Doppler imaging with normal direction of blood flow towards the liver. IMPRESSION: Heterogeneous liver with minimally lobulated contour raising possibility of cirrhosis with peri hepatic ascites. No dominant liver mass. 1.3 cm mobile gallstone. At points gallbladder wall appears thickened measuring up to 5.6 mm. However, per ultrasound technologist, patient was not tender over this region during scanning. These results will be called to the ordering clinician or representative by the Radiologist Assistant, and communication documented in the PACS or zVision Dashboard. Electronically Signed   By: Genia Del M.D.   On: 03/24/2017 10:03    Orson Eva, DO  [facility] Pager [phone]  If 7PM-7AM, please contact night-coverage [email or web address] Password [email or web address] 03/27/2017, 2:40 PM   LOS: 7 days

## 2017-03-27 NOTE — Progress Notes (Signed)
Earlier this morning tele called and rate was in 120's, nonsustained vtach with pvc's. Contacted Dr. Carles Collet and he ordered stat EKG and came and evaluated patient ordering lasix and cardizem.  Patient has gone down today for paracentesis with no fluid drawn off and thoracentesis with no fluid drawn off. Echo was performed in room.  New order now for [place] of chest. ST ordered puree food with thin liquids.  Daughter has been at bedside today

## 2017-03-27 NOTE — Progress Notes (Signed)
Have checked on patient three times this afternoon,   Has had no complaints.  Asked for drinks of tea.  Gown readjusted and covered with sheet.

## 2017-03-27 NOTE — Progress Notes (Signed)
*  PRELIMINARY RESULTS* Echocardiogram 2D Echocardiogram has been performed.  Leavy Cella 03/27/2017, 12:09 PM

## 2017-03-27 NOTE — Care Management Note (Signed)
Case Management Note  Patient Details  Name: Brett Carey MRN: [id number] Date of Birth: 05-02-1940  If discussed at Long Length of Stay Meetings, dates discussed:  03/27/17  Additional Comments:  Sherald Barge, RN 03/27/2017, 3:08 PM

## 2017-03-27 NOTE — Progress Notes (Addendum)
Nutrition Follow-up  DOCUMENTATION CODES:  Non-severe (moderate) malnutrition in context of acute illness/injury  INTERVENTION:  Continue Ensure BID  Will confer with dietary about ways to increase moisture of food  Addendum: SLP states pt requested D1 textures to help w/ moisture. No conversation w/ dietary needed.   NUTRITION DIAGNOSIS:  Moderate Malnutrition related to acute illness(Pneumonia) as evidenced by per patient/family report, mild fat depletion, mild muscle depletion.  Progressing; Resolving w/ improved PO intake.   GOAL:  Patient will meet greater than or equal to 90% of their needs  Ongoing  MONITOR:  PO intake, Supplement acceptance, Labs, Weight trends, I & O's    ASSESSMENT:  77 y/o male PMhx COPD, HF, Afib, HTN, tobacco abuse.  Initially presented with several days SOB and found to have PNA.   Interval hx: Hospital course complicated by AKI, dysphagia as well as newly detected cirrhosis w/ small amount of ascites, likely related to past hx of etoh abuse.  Minimal intake documentation available. He appears to have eaten 90% and 100% of a couple recent meal though. Pt interacts minimally with RD. Gives very brief, simple responses. He says he is eating fairly well and likes the food. His only comment is that the food "is a little dry". He is drinking the ENsure.   Denies any n/v/c/d.   Wt has varied since admit. Lowest bed wt since admit has been 216.5 lbs. Appears admit weight was ~223 lbs.   Little much more to offer nutritionally at this time. RD  Encouraged patient to continue to eat well and verbalize any barriers to PO intake that we can address.   Labs: Albumin: 3.2, WBC: 13.5, BNP: 518, Elevated LFTs-much improved. BUN/Creat now WDL. Glu: 140,  Meds: Oral ABx, Ensure, Lasix, Miralax, Norco  Recent Labs  Lab 03/25/17 0407 03/26/17 0455 03/27/17 0459  NA 138 137 137  K 4.3 4.5 4.7  CL 102 100* 97*  CO2 25 27 29   BUN 52* 45* 44*  CREATININE  0.80 0.72 0.75  CALCIUM 8.9 9.0 9.4  GLUCOSE 106* 104* 138*   Diet Order:  DIET DYS 3 Room service appropriate? Yes; Fluid consistency: Thin  EDUCATION NEEDS:  No education needs have been identified at this time  Skin:  Skin Assessment: Reviewed RN Assessment  Last BM:  1/30  Height:  Ht Readings from Last 1 Encounters:  03/21/17 5\' 4"  (1.626 m)   Weight:  Wt Readings from Last 1 Encounters:  03/27/17 221 lb 1.9 oz (100.3 kg)   Wt Readings from Last 10 Encounters:  03/27/17 221 lb 1.9 oz (100.3 kg)  01/08/14 249 lb 4.8 oz (113.1 kg)   Ideal Body Weight:  59 kg  BMI:  Body mass index is 37.96 kg/m.  Estimated Nutritional Needs:  Kcal:  2100-2300 (HBEx1.2-1.3) Protein:  83-95g Pro (1.4-1.6 g/kg bw) Fluid:  2 Liters  Burtis Junes RD, LDN, CNSC Clinical Nutrition Pager: [phone] 03/27/2017 2:24 PM

## 2017-03-27 NOTE — Progress Notes (Signed)
Patient ID: Brett Carey, male   DOB: December 15, 1940, 77 y.o.   MRN: [id number]   Request for right thoracentesis  Limited Chest [place] was performed Right chest Little to no fluid was visualized  No thoracentesis was performed  Back to room

## 2017-03-27 NOTE — Progress Notes (Signed)
Patient ID: Brett Carey, male   DOB: [birth year], 77 y.o.   MRN: [id number]   Request received for US guided paracentesis  Limited Abd US performed Shows NO fluid collection; NO ascites  Paracentesis not performed  Pt back to room

## 2017-03-27 NOTE — Clinical Social Work Note (Addendum)
LCSW following. Per MD, pt not yet stable for dc. MD anticipating another few days of hospital care. Spoke with Sharyn Lull at [facility] [phone]) to update. Per Sharyn Lull, they have not yet received authorization from pt's insurance. When they do receive it, Sharyn Lull will ask how long it is good for in case pt can dc over the weekend.   Will update pt's family and will follow up tomorrow.

## 2017-03-27 NOTE — Progress Notes (Signed)
  Speech Language Pathology Treatment: Dysphagia  Patient Details Name: Brett Carey MRN: [id number] DOB: 1940-11-27 Today's Date: 03/27/2017 Time: 5284-1324 SLP Time Calculation (min) (ACUTE ONLY): 30 min  Assessment / Plan / Recommendation Clinical Impression  SLP was working at front desk when Pt's daughter called me to room to say that Pt was "choking on his chicken". Upon SLP arrival, Pt was semi reclined in bed and was holding a cup with masticated and expectorated chicken. Pt repositioned upright and reinforced need to sit upright for all meals, preferably up in chair. Pt requested that his food be "mashed up" as it was "too dry". Pt with poor dentition and did benefit from moist, chopped foods. Will downgrade to D1/puree with thin liquids and will proceed with MBSS tomorrow around 4 PM due to lack of improvement since admission. Discussed with Dr. Carles Collet.    HPI HPI: 77 year old male with a history of COPD, atrial fibrillation, presented to the hospital with progressive shortness of breath.  Found to have COPD exacerbation as well as community-acquired pneumonia.  He had respiratory failure requiring BiPAP therapy, now on nasal cannula.  He has been treated with steroids, nebs and antibiotics. Hospital course complicated by development of AKI due to hypotension, which has since improved. He is slowly improving. Swallow evaluation pending for dysphagia. Will need SNF placement on discharge. Anticipate discharge in the next 1-2 days.      SLP Plan  Continue with current plan of care;MBS       Recommendations  Diet recommendations: Dysphagia 1 (puree);Thin liquid Liquids provided via: Cup;Straw Medication Administration: Whole meds with puree Supervision: Staff to assist with self feeding;Full supervision/cueing for compensatory strategies Compensations: Slow rate;Small sips/bites Postural Changes and/or Swallow Maneuvers: Seated upright 90 degrees;Upright 30-60 min after meal                 Oral Care Recommendations: Oral care BID;Staff/trained caregiver to provide oral care Follow up Recommendations: Skilled Nursing facility Plan: Continue with current plan of care;MBS       Thank you,  Genene Churn, [place]                 [place] 03/27/2017, 5:24 PM

## 2017-03-28 ENCOUNTER — Inpatient Hospital Stay (HOSPITAL_COMMUNITY): Payer: Medicare HMO

## 2017-03-28 LAB — CBC
HCT: 51.4 % (ref 39.0–52.0)
Hemoglobin: 16.5 g/dL (ref 13.0–17.0)
MCH: 31.4 pg (ref 26.0–34.0)
MCHC: 32.1 g/dL (ref 30.0–36.0)
MCV: 97.7 fL (ref 78.0–100.0)
PLATELETS: 71 10*3/uL — AB (ref 150–400)
RBC: 5.26 MIL/uL (ref 4.22–5.81)
RDW: 16.8 % — AB (ref 11.5–15.5)
WBC: 14.7 10*3/uL — ABNORMAL HIGH (ref 4.0–10.5)

## 2017-03-28 LAB — BASIC METABOLIC PANEL
Anion gap: 12 (ref 5–15)
BUN: 40 mg/dL — AB (ref 6–20)
CALCIUM: 9.4 mg/dL (ref 8.9–10.3)
CO2: 33 mmol/L — ABNORMAL HIGH (ref 22–32)
Chloride: 92 mmol/L — ABNORMAL LOW (ref 101–111)
Creatinine, Ser: 0.69 mg/dL (ref 0.61–1.24)
GFR calc Af Amer: >60 mL/min (ref >60)
GLUCOSE: 86 mg/dL (ref 65–99)
Potassium: 4.6 mmol/L (ref 3.5–5.1)
Sodium: 137 mmol/L (ref 135–145)

## 2017-03-28 LAB — MAGNESIUM: Magnesium: 1.6 mg/dL — ABNORMAL LOW (ref 1.7–2.4)

## 2017-03-28 MED ORDER — MAGNESIUM SULFATE 2 GM/50ML IV SOLN
2.0000 g | Freq: Once | INTRAVENOUS | Status: AC
  Administered 2017-03-28: 2 g via INTRAVENOUS
  Filled 2017-03-28: qty 50

## 2017-03-28 NOTE — Progress Notes (Signed)
Physical Therapy Treatment Patient Details Name: Brett Carey MRN: [id number] DOB: [birth year] Today's Date: 03/28/2017    History of Present Illness Fuquan Wilson is a 77 y.o. male with medical history significant of COPD, CHF, A. fib on Eliquis, hypertension comes in with several days of worsening shortness of breath and cough.  Patient takes care of his wife who has dementia at home and was very reluctant and refusing to come to the hospital according to his daughter because of his concerns with his wife being at home alone.  His daughter arranged for mom to be taking care of and finally convinced him to come to the ED.  Daughter reports that he has been very short of breath since yesterday.  He has been coughing a lot.  Patient denies any fevers or chills.  He has been wheezing at home.  He is a smoker and he does have oxygen as needed at home.  He has not had any nausea vomiting or diarrhea.  He has not been recently hospitalized or on recent antibiotics.  Patient found to have pneumonia in the ED and referred for admission for pneumonia.  Per emergency room physician report patient was stable for MedSurg floor and was in no respiratory distress earlier.  On arrival to [facility] floor however he is in moderate respiratory distress with very diminished breath sounds.  He actually says that he is not short of breath when obviously he is.    PT Comments    Patient requires active assistance to complete LAQ's and seated marching while seated at bedside due to weakness, demonstrates increased BLE strength for taking steps while on 4 LPM, made it to doorway and back to bedside and tolerated sitting up in chair after therapy.  Patient will benefit from continued physical therapy in hospital and recommended venue below to increase strength, balance, endurance for safe ADLs and gait.    Follow Up Recommendations  SNF     Equipment Recommendations  None recommended by PT    Recommendations for Other  Services       Precautions / Restrictions Precautions Precautions: Fall Restrictions Weight Bearing Restrictions: No    Mobility  Bed Mobility Overal bed mobility: Needs Assistance Bed Mobility: Supine to Sit     Supine to sit: Min assist     General bed mobility comments: fair/poor return for propping up on elbows during supine to sit  Transfers Overall transfer level: Needs assistance Equipment used: Rolling walker (2 wheeled) Transfers: Sit to/from [facility] Sit to Stand: Min assist Stand pivot transfers: Min assist       General transfer comment: slight improvement for transfers  Ambulation/Gait Ambulation/Gait assistance: Min assist Ambulation Distance (Feet): 16 Feet Assistive device: Rolling walker (2 wheeled) Gait Pattern/deviations: Decreased step length - right;Decreased step length - left;Decreased stride length   Gait velocity interpretation: Below normal speed for age/gender General Gait Details: demonstrates slow labored movement with VC's to step closer to RW with fair return, tends to keep trunk flexed with RW to far in front, limited secondary to fatigue   Stairs            Wheelchair Mobility    Modified Rankin (Stroke Patients Only)       Balance Overall balance assessment: Needs assistance Sitting-balance support: Feet supported;No upper extremity supported Sitting balance-Leahy Scale: Good     Standing balance support: Bilateral upper extremity supported;During functional activity Standing balance-Leahy Scale: Fair Standing balance comment: fair with RW  Cognition Arousal/Alertness: Awake/alert Behavior During Therapy: WFL for tasks assessed/performed Overall Cognitive Status: Within Functional Limits for tasks assessed                                        Exercises General Exercises - Lower Extremity Long Arc Quad: Seated;AAROM;Strengthening;Both;10  reps Hip Flexion/Marching: Seated;AAROM;Strengthening;10 reps Toe Raises: Seated;AROM;Strengthening;10 reps    General Comments        Pertinent Vitals/Pain Pain Assessment: No/denies pain    Home Living                      Prior Function            PT Goals (current goals can now be found in the care plan section) Acute Rehab PT Goals Patient Stated Goal: return home PT Goal Formulation: With patient Time For Goal Achievement: 04/07/17 Potential to Achieve Goals: Good Progress towards PT goals: Progressing toward goals    Frequency    Min 3X/week      PT Plan Current plan remains appropriate    Co-evaluation              AM-PAC PT "6 Clicks" Daily Activity  Outcome Measure  Difficulty turning over in bed (including adjusting bedclothes, sheets and blankets)?: A Little Difficulty moving from lying on back to sitting on the side of the bed? : A Little Difficulty sitting down on and standing up from a chair with arms (e.g., wheelchair, bedside commode, etc,.)?: A Little Help needed moving to and from a bed to chair (including a wheelchair)?: A Lot Help needed walking in hospital room?: A Lot Help needed climbing 3-5 steps with a railing? : Total 6 Click Score: 14    End of Session Equipment Utilized During Treatment: Gait belt;Oxygen Activity Tolerance: Patient tolerated treatment well;Patient limited by fatigue Patient left: in chair;with call bell/phone within reach;with chair alarm set Nurse Communication: Mobility status PT Visit Diagnosis: Unsteadiness on feet (R26.81);Other abnormalities of gait and mobility (R26.89);Muscle weakness (generalized) (M62.81)     Time: 3300-7622 PT Time Calculation (min) (ACUTE ONLY): 27 min  Charges:  $Therapeutic Activity: 23-37 mins                    G Codes:       4:04 PM, 04-11-17 Lonell Grandchild, MPT Physical Therapist with [facility] 336 [phone] office [phone] mobile  phone

## 2017-03-28 NOTE — Clinical Social Work Note (Signed)
LCSW following. Per MD, pt may be stable for dc to SNF over the weekend. Spoke with Sharyn Lull at [facility] in [place] to update. They have the [facility] authorization and pt can dc over the weekend if he is stable. Michelle's contact number over the weekend is [phone]. Pt will admit to room 209A and the phone number for report is [phone].  Weekend SW will be available to assist with dc.

## 2017-03-28 NOTE — Progress Notes (Signed)
Has eaten well today.  Daughter has been at bedside and talked with Dr. Carles Collet about plan.  Assisted to bedside commode and had medium soft bm.

## 2017-03-28 NOTE — Plan of Care (Signed)
Pt resting with eyes closed  Will continue to monitor

## 2017-03-28 NOTE — Progress Notes (Signed)
Modified Barium Swallow Progress Note  Patient Details  Name: Brett Carey MRN: [id number] Date of Birth: 01/01/1941  Today's Date: 03/28/2017  Modified Barium Swallow completed.  Was unable to upload to reading worklist.  Brief recommendations include the following:  Clinical Impression  Pt currently with suspected primary esophageal dysphagia. Pt did have a delay in swallow initiation to the pyriforms and flash penetration x1 of thin liquids which was not considered abnormal. Of concern was esophageal sweep when pt swallowed barium tablet; esophagus was filled to mid-esophagus with barium material; pill eventually cleared to stomach with numerous follow-up boluses of puree to push it through. At end of study, barium material had still filled to the level of the mid-upper esophagus. Pt is at an increased risk of aspiration due to esophageal function. Recommend dysphagia 2/ thin liquids, meds crushed in puree, supervision to assist with feeding, with strict esophageal precautions- sitting upright 90 degrees, small bites/ sips, alternate food/ liquid, have pt sit upright at least an hour after meal before reclining. Pt also may benefit from more frequent, smaller meals. Would also highly recommend GI consult to further assess esophageal function. Will f/u to to ensure diet tolerance/ review compensatory strategies.    Swallow Evaluation Recommendations   Recommended Consults: Consider esophageal assessment   SLP Diet Recommendations: Dysphagia 2 (Fine chop) solids;Thin liquid   Liquid Administration via: Cup;Straw   Medication Administration: Crushed with puree   Supervision: Staff to assist with self feeding;Full supervision/cueing for compensatory strategies   Compensations: Slow rate;Small sips/bites;Follow solids with liquid   Postural Changes: Remain semi-upright after after feeds/meals (Comment);Seated upright at 90 degrees   Oral Care Recommendations: Oral care BID   Other  Recommendations: Clarify dietary restrictions    Amy Dionicia Abler, MA, CCC-SLP 03/28/2017,5:06 PM

## 2017-03-28 NOTE — Care Management Important Message (Signed)
Important Message  Patient Details  Name: Brett Carey MRN: [id number] Date of Birth: [birth year]   Medicare Important Message Given:  Yes    Delainee Tramel, Chauncey Reading, RN 03/28/2017, 12:12 PM

## 2017-03-28 NOTE — Progress Notes (Signed)
PROGRESS NOTE  Brett Carey QVZ:[id number] DOB: [birth year] DOA: 03/20/2017 PCP: Celene Squibb, MD Brief History: 77 year old male with a history of essential hypertension, COPD, atrial fibrillation presented with shortness of breath, dyspnea on exertion, and confusion. At the time of admission, the patient was in rate controlled atrial fibrillation. He was started on azithromycin and ceftriaxone. Initial chest x-ray showed vascular congestion with moderate pleural effusion. The patient was given furosemide 80 mg IV x1. The patient was noted to be hypoxic requiring 3 L of oxygen.  Assessment/Plan: Acute respiratory failure with hypoxia -Secondary to pneumonia and COPD exacerbation -CT chest to clarify pleural effusions vs atelec vs consolidation -03/26/17 CT chest--moderate emphysema.  Moderate partially loculated right pleural effusion.  Consolidation-RLL small perihepatic ascites;   Community acquired pneumonia/Lobar pneumonia -Initially started on ceftriaxone and azithromycin -changed to amox/clav on 1/29 -d/c abx after 03/27/17 doses--finished 7 days abx  COPD exacerbation -Continue weaning steroids -stable on 3 L -50 pk years tobacco  Chronic Atrial Fibrillation -continue apixaban -d/c metoprolol -continue diltiazem 30 mg po q 6 hrs  Pleural Effusion -03/26/17 CT chest--moderate emphysema.  Moderate partially loculated right pleural effusion.  Consolidation-RLL small perihepatic ascites;  -May be parapneumonic versus CHF -attempted thora--complex fluid in chest, insufficient to perform  Dysphagia -MBS--reveals mostly esophageal issue -dysphagia 2 with thin liquids -will consult GI, but doubt he is EGD candidate presently  AKI -due to infectious proceess -baseline creatinine 0.8-1.0 -serum creatinine peaked 1.86 -improved  Acute on Chronic diastolic CHF -check PIR--518 -Echo--EF 65-70%, no WMA, PASP 38, mild to moderate TR -daily weights--up 7  lbs -continue IV furosemide--NEG 3.6 L past 24hr -fluid restrict  Transaminasemia -due to hypotension -improving -1/28 RUQ US--heterogenous liver concerning for cirrhosis with perihepatic ascites;cholelithiasis with gallbladder wall thickening; no ductal dilatation -no abdominal pain  Liver Cirrhosiswith ascites -noted on abdominal imaging -likely due Etoh -hep B surface antigen -hep c antibody -attempted paracentesis-->not enough fluid  Moderate malnutrition -continue supplements  Hypomagnesemia -repleted   Disposition Plan:SNF 2/3 or 2/4 Family Communication:Daughter updated at bedside 2/1   Consultants:none  Code Status: FULL   DVT Prophylaxis:apixaban   Procedures: As Listed in Progress Note Above  Antibiotics: Ceftriaxone 1/24>>>1/28 azithro 1/24>>>1/28 Amox/clav 1/29>>>     Subjective: Patient denies fevers, chills, headache, chest pain, dyspnea, nausea, vomiting, diarrhea, abdominal pain, dysuria, hematuria, hematochezia, and melena.   Objective: Vitals:   03/27/17 2140 03/28/17 0535 03/28/17 0800 03/28/17 1449  BP: 135/89 115/89    Pulse: (!) 118 95    Resp: 20     Temp: (!) 97.5 F (36.4 C)     TempSrc: Axillary     SpO2: 99% 95% 96% 93%  Weight:  96.1 kg (211 lb 13.8 oz)    Height:        Intake/Output Summary (Last 24 hours) at 03/28/2017 1732 Last data filed at 03/28/2017 1600 Gross per 24 hour  Intake 1560 ml  Output 3300 ml  Net -1740 ml   Weight change: -4.2 kg (-4.2 oz) Exam:   General:  Pt is alert, follows commands appropriately, not in acute distress  HEENT: No icterus, No thrush, No neck mass, [place]/AT  Cardiovascular: RRR, S1/S2, no rubs, no gallops  Respiratory: bibasilar crackles, diminished BS R-base  Abdomen: Soft/+BS, non tender, non distended, no guarding  Extremities: 2 + LE edema, No lymphangitis, No petechiae, No rashes, no synovitis   Data Reviewed: I have personally reviewed  following labs and  imaging studies Basic Metabolic Panel: Recent Labs  Lab 03/24/17 0453 03/25/17 0407 03/26/17 0455 03/27/17 0459 03/28/17 0544  NA 137 138 137 137 137  K 4.5 4.3 4.5 4.7 4.6  CL 102 102 100* 97* 92*  CO2 22 25 27 29  33*  GLUCOSE 107* 106* 104* 138* 86  BUN 67* 52* 45* 44* 40*  CREATININE 1.11 0.80 0.72 0.75 0.69  CALCIUM 8.3* 8.9 9.0 9.4 9.4  MG  --   --   --   --  1.6*   Liver Function Tests: Recent Labs  Lab 03/23/17 0821 03/24/17 0453 03/25/17 0407 03/26/17 0455 03/27/17 0459  AST 434* 183* 109* 108* 80*  ALT 422* 320* 260* 235* 199*  ALKPHOS 63 62 64 67 71  BILITOT 2.2* 2.1* 2.3* 2.7* 2.9*  PROT 6.2* 5.8* 5.8* 5.8* 6.0*  ALBUMIN 3.2* 3.0* 3.1* 3.1* 3.2*   No results for input(s): LIPASE, AMYLASE in the last 168 hours. No results for input(s): AMMONIA in the last 168 hours. Coagulation Profile: No results for input(s): INR, PROTIME in the last 168 hours. CBC: Recent Labs  Lab 03/22/17 0421 03/23/17 0821 03/26/17 0455 03/27/17 0459 03/28/17 0544  WBC 14.8* 16.2* 13.8* 13.5* 14.7*  HGB 16.2 16.3 16.0 16.3 16.5  HCT 50.4 50.6 50.6 51.4 51.4  MCV 96.2 96.7 97.3 97.5 97.7  PLT 143* 125* 86* 80* 71*   Cardiac Enzymes: No results for input(s): CKTOTAL, CKMB, CKMBINDEX, TROPONINI in the last 168 hours. BNP: Invalid input(s): POCBNP CBG: No results for input(s): GLUCAP in the last 168 hours. HbA1C: No results for input(s): HGBA1C in the last 72 hours. Urine analysis:    Component Value Date/Time   COLORURINE AMBER (A) 03/20/2017 1900   APPEARANCEUR HAZY (A) 03/20/2017 1900   LABSPEC 1.025 03/20/2017 1900   PHURINE 5.0 03/20/2017 1900   GLUCOSEU NEGATIVE 03/20/2017 1900   HGBUR NEGATIVE 03/20/2017 1900   BILIRUBINUR SMALL (A) 03/20/2017 1900   KETONESUR NEGATIVE 03/20/2017 1900   PROTEINUR 100 (A) 03/20/2017 1900   NITRITE NEGATIVE 03/20/2017 1900   LEUKOCYTESUR NEGATIVE 03/20/2017 1900   Sepsis  Labs: @LABRCNTIP (procalcitonin:4,lacticidven:4) ) Recent Results (from the past 240 hour(s))  Culture, blood (Routine x 2)     Status: None   Collection Time: 03/20/17  7:09 PM  Result Value Ref Range Status   Specimen Description RIGHT ANTECUBITAL  Final   Special Requests   Final    BOTTLES DRAWN AEROBIC ONLY Blood Culture adequate volume   Culture NO GROWTH 5 DAYS  Final   Report Status 03/25/2017 FINAL  Final  Culture, blood (Routine x 2)     Status: None   Collection Time: 03/20/17  7:09 PM  Result Value Ref Range Status   Specimen Description BLOOD LEFT HAND  Final   Special Requests   Final    BOTTLES DRAWN AEROBIC ONLY Blood Culture adequate volume   Culture NO GROWTH 5 DAYS  Final   Report Status 03/25/2017 FINAL  Final  MRSA PCR Screening     Status: None   Collection Time: 03/21/17  1:27 AM  Result Value Ref Range Status   MRSA by PCR NEGATIVE NEGATIVE Final    Comment:        The GeneXpert MRSA Assay (FDA approved for NASAL specimens only), is one component of a comprehensive MRSA colonization surveillance program. It is not intended to diagnose MRSA infection nor to guide or monitor treatment for MRSA infections.      Scheduled Meds: . apixaban  5 mg Oral BID  . diltiazem  30 mg Oral Q6H  . feeding supplement (ENSURE ENLIVE)  237 mL Oral BID BM  . furosemide  40 mg Intravenous Daily  . guaiFENesin  1,200 mg Oral BID  . HYDROcodone-acetaminophen  1 tablet Oral Q8H  . ipratropium-albuterol  3 mL Nebulization TID  . mouth rinse  15 mL Mouth Rinse BID  . polyethylene glycol  17 g Oral Daily  . predniSONE  30 mg Oral Q breakfast  . sodium chloride flush  3 mL Intravenous Q12H   Continuous Infusions: . sodium chloride      Procedures/Studies: Dg Chest 2 View  Result Date: 03/20/2017 CLINICAL DATA:  Generalize weakness with decreased appetite and short of breath EXAM: CHEST  2 VIEW COMPARISON:  01/04/2014 FINDINGS: Suspected moderate right pleural  effusion. Cardiomegaly with vascular congestion and mild pulmonary edema. Aortic atherosclerosis. No pneumothorax. Mild degenerative changes of the spine. Airspace disease at the right middle lobe and lung base. IMPRESSION: 1. Cardiomegaly with vascular congestion and mild pulmonary edema. Suspected moderate right pleural effusion 2. Airspace disease at the right middle lobe and lung base could reflect superimposed pneumonia Electronically Signed   By: Donavan Foil M.D.   On: 03/20/2017 19:07   Ct Chest Wo Contrast  Result Date: 03/26/2017 CLINICAL DATA:  COPD and hypoxia.  Evaluate effusions. EXAM: CT CHEST WITHOUT CONTRAST TECHNIQUE: Multidetector CT imaging of the chest was performed following the standard protocol without IV contrast. COMPARISON:  Chest radiograph from 03/20/2017 FINDINGS: Cardiovascular: There is moderate to marked cardiac enlargement. No pericardial effusion. Aortic atherosclerosis. Calcifications are noted within the LAD Coronary artery. Mediastinum/Nodes: The trachea is patent and midline. Normal appearance of the esophagus. No mediastinal or hilar adenopathy. No axillary or supraclavicular adenopathy. Lungs/Pleura: Moderate changes of emphysema. Small left pleural effusion with overlying subpleural atelectasis. There is a moderate volume partially loculated right pleural effusion. There is compressive type atelectasis and consolidation within the right lower lobe. Complete atelectasis/consolidation of the right middle lobe identified. Upper Abdomen: There is a small volume of perihepatic ascites. Low-attenuation left adrenal nodule measures 2 cm and likely represents a benign adenoma. Musculoskeletal: There is degenerative disc disease noted within the thoracic spine. No aggressive lytic or sclerotic bone lesions. IMPRESSION: 1. Moderate partially loculated right pleural effusion with complete atelectasis/consolidation of the right middle lobe. Followup imaging is advised to ensure  re-expansion of the right middle lobe. If this does not improve then consider further evaluation with either bronchoscopy or PET-CT to assess for underlying lung lesion. 2. Small left pleural effusion. 3. Marked cardiac enlargement, aortic atherosclerosis and LAD coronary artery calcifications. Aortic Atherosclerosis (ICD10-I70.0). 4. Small volume of ascites within the right upper quadrant of the abdomen. 5. Diffuse bronchial wall thickening with emphysema, as above; imaging findings suggestive of underlying COPD. Emphysema (ICD10-J43.9). Electronically Signed   By: Kerby Moors M.D.   On: 03/26/2017 19:28   [place] Chest (pleural Effusion)  Result Date: 03/27/2017 CLINICAL DATA:  RIGHT pleural effusion, for thoracentesis EXAM: CHEST ULTRASOUND COMPARISON:  Chest CT 03/26/2017 FINDINGS: Sonography of the RIGHT hemithorax was performed. A thin rim of complicated fluid is seen surrounding the RIGHT lung base, containing scattered internal echoes. Volume of fluid visualized is insufficient for thoracentesis. IMPRESSION: Complex appearing fluid at the inferior RIGHT hemithorax, insufficient volume for thoracentesis. Electronically Signed   By: Lavonia Dana M.D.   On: 03/27/2017 12:51   US Renal  Result Date: 03/22/2017 CLINICAL DATA:  Hypertension.  Acute  kidney injury. EXAM: RENAL / URINARY TRACT ULTRASOUND COMPLETE COMPARISON:  None. FINDINGS: Right Kidney: Length: 10.9 cm. Mild cortical thinning. No hydronephrosis. 2 cm cyst in the lower pole. Left Kidney: Length: 11.0 cm. Cortical thickness appears minimally thin end. Multiple cysts, the largest measuring 1.8 cm in the midportion. Bladder: Foley catheter in place. Some ascites is noted.  Probable cirrhosis of the liver. IMPRESSION: Kidneys are normal in size but show mild cortical thinning, right more than left. No hydronephrosis or significant focal lesion. Benign appearing simple cysts. Foley catheter in the bladder. Probable cirrhosis and ascites.  Electronically Signed   By: Nelson Chimes M.D.   On: 03/22/2017 14:26   [place] Ascites (abdomen Limited)  Result Date: 03/27/2017 CLINICAL DATA:  Ascites, history atrial fibrillation, CHF, COPD, hypertension EXAM: LIMITED ABDOMEN ULTRASOUND FOR ASCITES TECHNIQUE: Limited ultrasound survey for ascites was performed in all four abdominal quadrants. COMPARISON:  Abdomen ultrasound 03/24/2017, CT chest 03/26/2017 FINDINGS: No ascites is sonographically identified. IMPRESSION: No ascites identified. Electronically Signed   By: Lavonia Dana M.D.   On: 03/27/2017 09:34   US Abdomen Limited Ruq  Result Date: 03/24/2017 CLINICAL DATA:  77 year old male with elevated LFTs. Subsequent encounter. EXAM: ULTRASOUND ABDOMEN LIMITED RIGHT UPPER QUADRANT COMPARISON:  03/22/2017 renal sonogram. FINDINGS: Gallbladder: 1.3 cm mobile gallstone. At points gallbladder wall appears thickened measuring up to 5.6 mm. Per ultrasound technologist, patient was not tender over this region during scanning. Common bile duct: Diameter: 4.9 mm Liver: Heterogeneous with minimally lobulated contour raising possibility of cirrhosis with peri hepatic ascites. No dominant liver mass. Portal vein is patent on color Doppler imaging with normal direction of blood flow towards the liver. IMPRESSION: Heterogeneous liver with minimally lobulated contour raising possibility of cirrhosis with peri hepatic ascites. No dominant liver mass. 1.3 cm mobile gallstone. At points gallbladder wall appears thickened measuring up to 5.6 mm. However, per ultrasound technologist, patient was not tender over this region during scanning. These results will be called to the ordering clinician or representative by the Radiologist Assistant, and communication documented in the PACS or zVision Dashboard. Electronically Signed   By: Genia Del M.D.   On: 03/24/2017 10:03    Orson Eva, DO  [facility] Pager [phone]  If 7PM-7AM, please contact  night-coverage [email or web address] Password [email or web address] 03/28/2017, 5:32 PM   LOS: 8 days

## 2017-03-29 DIAGNOSIS — R1310 Dysphagia, unspecified: Secondary | ICD-10-CM

## 2017-03-29 LAB — COMPREHENSIVE METABOLIC PANEL
ALK PHOS: 63 U/L (ref 38–126)
ALT: 127 U/L — ABNORMAL HIGH (ref 17–63)
ANION GAP: 11 (ref 5–15)
AST: 48 U/L — ABNORMAL HIGH (ref 15–41)
Albumin: 3.2 g/dL — ABNORMAL LOW (ref 3.5–5.0)
BILIRUBIN TOTAL: 2.9 mg/dL — AB (ref 0.3–1.2)
BUN: 35 mg/dL — ABNORMAL HIGH (ref 6–20)
CALCIUM: 9.1 mg/dL (ref 8.9–10.3)
CO2: 35 mmol/L — ABNORMAL HIGH (ref 22–32)
Chloride: 92 mmol/L — ABNORMAL LOW (ref 101–111)
Creatinine, Ser: 0.66 mg/dL (ref 0.61–1.24)
GFR calc Af Amer: >60 mL/min (ref >60)
GFR calc non Af Amer: >60 mL/min (ref >60)
GLUCOSE: 122 mg/dL — AB (ref 65–99)
POTASSIUM: 4.1 mmol/L (ref 3.5–5.1)
Sodium: 138 mmol/L (ref 135–145)
Total Protein: 5.7 g/dL — ABNORMAL LOW (ref 6.5–8.1)

## 2017-03-29 LAB — MAGNESIUM: Magnesium: 1.6 mg/dL — ABNORMAL LOW (ref 1.7–2.4)

## 2017-03-29 MED ORDER — MAGNESIUM SULFATE 2 GM/50ML IV SOLN
2.0000 g | Freq: Once | INTRAVENOUS | Status: AC
  Administered 2017-03-29: 2 g via INTRAVENOUS
  Filled 2017-03-29: qty 50

## 2017-03-29 MED ORDER — DILTIAZEM HCL 60 MG PO TABS
60.0000 mg | ORAL_TABLET | Freq: Four times a day (QID) | ORAL | Status: AC
  Administered 2017-03-30 – 2017-03-31 (×6): 60 mg via ORAL
  Filled 2017-03-29 (×6): qty 1

## 2017-03-29 MED ORDER — DILTIAZEM HCL 30 MG PO TABS
30.0000 mg | ORAL_TABLET | Freq: Once | ORAL | Status: AC
  Administered 2017-03-29: 30 mg via ORAL
  Filled 2017-03-29: qty 1

## 2017-03-29 MED ORDER — FUROSEMIDE 10 MG/ML IJ SOLN
40.0000 mg | Freq: Two times a day (BID) | INTRAMUSCULAR | Status: DC
  Administered 2017-03-29 – 2017-03-31 (×4): 40 mg via INTRAVENOUS
  Filled 2017-03-29 (×4): qty 4

## 2017-03-29 NOTE — Progress Notes (Signed)
PROGRESS NOTE  Brett Carey VWU:[id number] DOB: December 08, 1940 DOA: 03/20/2017 PCP: Celene Squibb, MD  Brief History: 77 year old male with a history of essential hypertension, COPD, atrial fibrillation presented with shortness of breath, dyspnea on exertion, and confusion. At the time of admission, the patient was in rate controlled atrial fibrillation. He was started on azithromycin and ceftriaxone. Initial chest x-ray showed vascular congestion with moderate pleural effusion. The patient was given furosemide 80 mg IV x1. The patient was noted to be hypoxic requiring 3 L of oxygen.  Assessment/Plan: Acute respiratory failure with hypoxia -Secondary to pneumonia, CHF and COPD exacerbation -CT chest to clarify pleural effusions vs atelec vs consolidation -03/26/17 CT chest--moderate emphysema. Moderate partially loculated right pleural effusion. Consolidation-RLLsmall perihepatic ascites;   Community acquired pneumonia/Lobar pneumonia -Initially started on ceftriaxone and azithromycin -changed to amox/clav on 1/29 -d/c abx after 03/27/17 doses--finished 7 days abx  COPD exacerbation -Continue weaning steroids -stable on 3 L -50 pk years tobacco  Chronic Atrial Fibrillation -continue apixaban -d/c metoprolol -increase diltiazem 60 mg po q 6 hrs  Acute onChronic diastolic CHF -check GNF--621 -Echo--EF 65-70%, no WMA, PASP 38, mild to moderate TR -daily weights--up 7 lbs -increase IV furosemide to bid -fluid restrict  Pleural Effusion -03/26/17 CT chest--moderate emphysema. Moderate partially loculated right pleural effusion. Consolidation-RLLsmall perihepatic ascites;  -May be parapneumonic versus CHF -attempted thora--complex fluid in chest, insufficient to perform  Dysphagia -MBS--reveals mostly esophageal issue -dysphagia 2 with thin liquids -not a candidate for EGD presently due to respiratory status -esophagram if possible  AKI -due to  infectious proceess -baseline creatinine 0.8-1.0 -serum creatinine peaked 1.86 -improved  Transaminasemia -due to hypotension -improving -1/28 RUQ US--heterogenous liver concerning for cirrhosis with perihepatic ascites;cholelithiasis with gallbladder wall thickening; no ductal dilatation -no abdominal pain  Liver Cirrhosiswith ascites -noted on abdominal imaging -likely due Etoh -hep B surface antigen -hep c antibody -attemptedparacentesis-->not enough fluid  Moderate malnutrition -continue supplements  Hypomagnesemia -repleted   Disposition Plan:SNFwhen stable Family Communication:Daughter updatedat bedside 2/1   Consultants:none  Code Status: FULL   DVT Prophylaxis:apixaban   Procedures: As Listed in Progress Note Above  Antibiotics: Ceftriaxone 1/24>>>1/28 azithro 1/24>>>1/28 Amox/clav 1/29>>>2/1       Subjective: Patient denies fevers, chills, headache, chest pain, dyspnea, nausea, vomiting, diarrhea, abdominal pain, dysuria, hematuria, hematochezia, and melena.   Objective: Vitals:   03/29/17 0622 03/29/17 0755 03/29/17 1300 03/29/17 1420  BP:   126/69   Pulse:   76   Resp:   18   Temp:   98 F (36.7 C)   TempSrc:   Oral   SpO2:  (!) 88% 95% 93%  Weight: 95.9 kg (211 lb 6.7 oz)     Height:        Intake/Output Summary (Last 24 hours) at 03/29/2017 1702 Last data filed at 03/29/2017 1200 Gross per 24 hour  Intake 1200 ml  Output 150 ml  Net 1050 ml   Weight change: -0.2 kg (-7.1 oz) Exam:   General:  Pt is alert, follows commands appropriately, not in acute distress  HEENT: No icterus, No thrush, No neck mass, [place]/AT  Cardiovascular: RRR, S1/S2, no rubs, no gallops  Respiratory: bibasilar crackles, no wheeze  Abdomen: Soft/+BS, non tender, non distended, no guarding  Extremities: 2+LE edema, No lymphangitis, No petechiae, No rashes, no synovitis   Data Reviewed: I have personally reviewed  following labs and imaging studies Basic Metabolic Panel: Recent Labs  Lab 03/25/17  4431 03/26/17 0455 03/27/17 0459 03/28/17 0544 03/29/17 0732  NA 138 137 137 137 138  K 4.3 4.5 4.7 4.6 4.1  CL 102 100* 97* 92* 92*  CO2 25 27 29  33* 35*  GLUCOSE 106* 104* 138* 86 122*  BUN 52* 45* 44* 40* 35*  CREATININE 0.80 0.72 0.75 0.69 0.66  CALCIUM 8.9 9.0 9.4 9.4 9.1  MG  --   --   --  1.6* 1.6*   Liver Function Tests: Recent Labs  Lab 03/24/17 0453 03/25/17 0407 03/26/17 0455 03/27/17 0459 03/29/17 0732  AST 183* 109* 108* 80* 48*  ALT 320* 260* 235* 199* 127*  ALKPHOS 62 64 67 71 63  BILITOT 2.1* 2.3* 2.7* 2.9* 2.9*  PROT 5.8* 5.8* 5.8* 6.0* 5.7*  ALBUMIN 3.0* 3.1* 3.1* 3.2* 3.2*   No results for input(s): LIPASE, AMYLASE in the last 168 hours. No results for input(s): AMMONIA in the last 168 hours. Coagulation Profile: No results for input(s): INR, PROTIME in the last 168 hours. CBC: Recent Labs  Lab 03/23/17 0821 03/26/17 0455 03/27/17 0459 03/28/17 0544  WBC 16.2* 13.8* 13.5* 14.7*  HGB 16.3 16.0 16.3 16.5  HCT 50.6 50.6 51.4 51.4  MCV 96.7 97.3 97.5 97.7  PLT 125* 86* 80* 71*   Cardiac Enzymes: No results for input(s): CKTOTAL, CKMB, CKMBINDEX, TROPONINI in the last 168 hours. BNP: Invalid input(s): POCBNP CBG: No results for input(s): GLUCAP in the last 168 hours. HbA1C: No results for input(s): HGBA1C in the last 72 hours. Urine analysis:    Component Value Date/Time   COLORURINE AMBER (A) 03/20/2017 1900   APPEARANCEUR HAZY (A) 03/20/2017 1900   LABSPEC 1.025 03/20/2017 1900   PHURINE 5.0 03/20/2017 1900   GLUCOSEU NEGATIVE 03/20/2017 1900   HGBUR NEGATIVE 03/20/2017 1900   BILIRUBINUR SMALL (A) 03/20/2017 1900   KETONESUR NEGATIVE 03/20/2017 1900   PROTEINUR 100 (A) 03/20/2017 1900   NITRITE NEGATIVE 03/20/2017 1900   LEUKOCYTESUR NEGATIVE 03/20/2017 1900   Sepsis Labs: @LABRCNTIP (procalcitonin:4,lacticidven:4) ) Recent Results (from the  past 240 hour(s))  Culture, blood (Routine x 2)     Status: None   Collection Time: 03/20/17  7:09 PM  Result Value Ref Range Status   Specimen Description RIGHT ANTECUBITAL  Final   Special Requests   Final    BOTTLES DRAWN AEROBIC ONLY Blood Culture adequate volume   Culture NO GROWTH 5 DAYS  Final   Report Status 03/25/2017 FINAL  Final  Culture, blood (Routine x 2)     Status: None   Collection Time: 03/20/17  7:09 PM  Result Value Ref Range Status   Specimen Description BLOOD LEFT HAND  Final   Special Requests   Final    BOTTLES DRAWN AEROBIC ONLY Blood Culture adequate volume   Culture NO GROWTH 5 DAYS  Final   Report Status 03/25/2017 FINAL  Final  MRSA PCR Screening     Status: None   Collection Time: 03/21/17  1:27 AM  Result Value Ref Range Status   MRSA by PCR NEGATIVE NEGATIVE Final    Comment:        The GeneXpert MRSA Assay (FDA approved for NASAL specimens only), is one component of a comprehensive MRSA colonization surveillance program. It is not intended to diagnose MRSA infection nor to guide or monitor treatment for MRSA infections.      Scheduled Meds: . apixaban  5 mg Oral BID  . diltiazem  30 mg Oral Q6H  . feeding supplement (ENSURE ENLIVE)  237 mL Oral BID BM  . furosemide  40 mg Intravenous BID  . guaiFENesin  1,200 mg Oral BID  . HYDROcodone-acetaminophen  1 tablet Oral Q8H  . ipratropium-albuterol  3 mL Nebulization TID  . mouth rinse  15 mL Mouth Rinse BID  . polyethylene glycol  17 g Oral Daily  . predniSONE  30 mg Oral Q breakfast  . sodium chloride flush  3 mL Intravenous Q12H   Continuous Infusions: . sodium chloride    . magnesium sulfate 1 - 4 g bolus IVPB      Procedures/Studies: Dg Chest 2 View  Result Date: 03/20/2017 CLINICAL DATA:  Generalize weakness with decreased appetite and short of breath EXAM: CHEST  2 VIEW COMPARISON:  01/04/2014 FINDINGS: Suspected moderate right pleural effusion. Cardiomegaly with vascular  congestion and mild pulmonary edema. Aortic atherosclerosis. No pneumothorax. Mild degenerative changes of the spine. Airspace disease at the right middle lobe and lung base. IMPRESSION: 1. Cardiomegaly with vascular congestion and mild pulmonary edema. Suspected moderate right pleural effusion 2. Airspace disease at the right middle lobe and lung base could reflect superimposed pneumonia Electronically Signed   By: Donavan Foil M.D.   On: 03/20/2017 19:07   Ct Chest Wo Contrast  Result Date: 03/26/2017 CLINICAL DATA:  COPD and hypoxia.  Evaluate effusions. EXAM: CT CHEST WITHOUT CONTRAST TECHNIQUE: Multidetector CT imaging of the chest was performed following the standard protocol without IV contrast. COMPARISON:  Chest radiograph from 03/20/2017 FINDINGS: Cardiovascular: There is moderate to marked cardiac enlargement. No pericardial effusion. Aortic atherosclerosis. Calcifications are noted within the LAD Coronary artery. Mediastinum/Nodes: The trachea is patent and midline. Normal appearance of the esophagus. No mediastinal or hilar adenopathy. No axillary or supraclavicular adenopathy. Lungs/Pleura: Moderate changes of emphysema. Small left pleural effusion with overlying subpleural atelectasis. There is a moderate volume partially loculated right pleural effusion. There is compressive type atelectasis and consolidation within the right lower lobe. Complete atelectasis/consolidation of the right middle lobe identified. Upper Abdomen: There is a small volume of perihepatic ascites. Low-attenuation left adrenal nodule measures 2 cm and likely represents a benign adenoma. Musculoskeletal: There is degenerative disc disease noted within the thoracic spine. No aggressive lytic or sclerotic bone lesions. IMPRESSION: 1. Moderate partially loculated right pleural effusion with complete atelectasis/consolidation of the right middle lobe. Followup imaging is advised to ensure re-expansion of the right middle lobe.  If this does not improve then consider further evaluation with either bronchoscopy or PET-CT to assess for underlying lung lesion. 2. Small left pleural effusion. 3. Marked cardiac enlargement, aortic atherosclerosis and LAD coronary artery calcifications. Aortic Atherosclerosis (ICD10-I70.0). 4. Small volume of ascites within the right upper quadrant of the abdomen. 5. Diffuse bronchial wall thickening with emphysema, as above; imaging findings suggestive of underlying COPD. Emphysema (ICD10-J43.9). Electronically Signed   By: Kerby Moors M.D.   On: 03/26/2017 19:28   [place] Chest (pleural Effusion)  Result Date: 03/27/2017 CLINICAL DATA:  RIGHT pleural effusion, for thoracentesis EXAM: CHEST ULTRASOUND COMPARISON:  Chest CT 03/26/2017 FINDINGS: Sonography of the RIGHT hemithorax was performed. A thin rim of complicated fluid is seen surrounding the RIGHT lung base, containing scattered internal echoes. Volume of fluid visualized is insufficient for thoracentesis. IMPRESSION: Complex appearing fluid at the inferior RIGHT hemithorax, insufficient volume for thoracentesis. Electronically Signed   By: Lavonia Dana M.D.   On: 03/27/2017 12:51   US Renal  Result Date: 03/22/2017 CLINICAL DATA:  Hypertension.  Acute kidney injury. EXAM: RENAL / URINARY TRACT  ULTRASOUND COMPLETE COMPARISON:  None. FINDINGS: Right Kidney: Length: 10.9 cm. Mild cortical thinning. No hydronephrosis. 2 cm cyst in the lower pole. Left Kidney: Length: 11.0 cm. Cortical thickness appears minimally thin end. Multiple cysts, the largest measuring 1.8 cm in the midportion. Bladder: Foley catheter in place. Some ascites is noted.  Probable cirrhosis of the liver. IMPRESSION: Kidneys are normal in size but show mild cortical thinning, right more than left. No hydronephrosis or significant focal lesion. Benign appearing simple cysts. Foley catheter in the bladder. Probable cirrhosis and ascites. Electronically Signed   By: Nelson Chimes M.D.    On: 03/22/2017 14:26   Dg Swallowing Func-speech Pathology  Result Date: 03/29/2017 Objective Swallowing Evaluation: Type of Study: MBS-Modified Barium Swallow Study  Patient Details Name: Brett Carey MRN: [id number] Date of Birth: Mar 23, 1940 Today's Date: 03/29/2017 Time: SLP Start Time (ACUTE ONLY): 1620 -SLP Stop Time (ACUTE ONLY): 1652 SLP Time Calculation (min) (ACUTE ONLY): 32 min Past Medical History: Past Medical History: Diagnosis Date . Atrial fibrillation with RVR ([place]) 01/04/2014 . CHF (congestive heart failure) ([place])  . COPD (chronic obstructive pulmonary disease) ([place])  . Gout  . History of hiatal hernia  . Hypertension  . Pneumonia X 2 Past Surgical History: Past Surgical History: Procedure Laterality Date . BACK SURGERY   . CATARACT EXTRACTION W/ INTRAOCULAR LENS  IMPLANT, BILATERAL Bilateral 2000's . LUMBAR DISC SURGERY   HPI: 78 year old male with a history of COPD, atrial fibrillation, presented to the hospital with progressive shortness of breath.  Found to have COPD exacerbation as well as community-acquired pneumonia.  He had respiratory failure requiring BiPAP therapy, now on nasal cannula.  He has been treated with steroids, nebs and antibiotics. Hospital course complicated by development of AKI due to hypotension, which has since improved. He is slowly improving. Swallow evaluation pending for dysphagia. Will need SNF placement on discharge. Anticipate discharge in the next 1-2 days.  Subjective: "He says food won't go down." Assessment / Plan / Recommendation CHL IP CLINICAL IMPRESSIONS 03/28/2017 Clinical Impression Pt currently with what is suspected to be a primary esophageal dysphagia. Pt did have a delay in swallow initiation and flash penetration x1 of thin liquids which was not considered abnormal. Of concern was esophageal sweep when pt swallowed barium tablet and esophagus was nearly full of barium material; pill eventually cleared to stomach with numerous follow-up boluses of  puree to push it through. Pt is at an increased risk of aspiration due to esophageal function. Recommend dysphagia 2/ thin liquids, meds crushed in puree, supervision to assist with feeding, with strict esophageal precautions- sitting upright 90 degrees, small bites/ sips, alternate food/ liquid, have pt sit upright at least an hour after meal before reclining. Pt also may benefit from more frequent, smaller meals. Would also highly recommend GI consult to further assess esophageal function. Will f/u to to ensure diet tolerance/ review compensatory strategies.  SLP Visit Diagnosis Dysphagia, pharyngoesophageal phase (R13.14) Attention and concentration deficit following -- Frontal lobe and executive function deficit following -- Impact on safety and function Moderate aspiration risk   CHL IP TREATMENT RECOMMENDATION 03/28/2017 Treatment Recommendations Therapy as outlined in treatment plan below   Prognosis 03/28/2017 Prognosis for Safe Diet Advancement Fair Barriers to Reach Goals Other (Comment) Barriers/Prognosis Comment -- CHL IP DIET RECOMMENDATION 03/28/2017 SLP Diet Recommendations Dysphagia 2 (Fine chop) solids;Thin liquid Liquid Administration via Cup;Straw Medication Administration Crushed with puree Compensations Slow rate;Small sips/bites;Follow solids with liquid Postural Changes Remain semi-upright after after feeds/meals (Comment);Seated  upright at 90 degrees   CHL IP OTHER RECOMMENDATIONS 03/28/2017 Recommended Consults Consider esophageal assessment Oral Care Recommendations Oral care BID Other Recommendations Clarify dietary restrictions   CHL IP FOLLOW UP RECOMMENDATIONS 03/28/2017 Follow up Recommendations Skilled Nursing facility   [facility] IP FREQUENCY AND DURATION 03/28/2017 Speech Therapy Frequency (ACUTE ONLY) min 1 x/week Treatment Duration 1 week      CHL IP ORAL PHASE 03/28/2017 Oral Phase Impaired Oral - Pudding Teaspoon -- Oral - Pudding Cup -- Oral - Honey Teaspoon -- Oral - Honey Cup -- Oral - Nectar  Teaspoon -- Oral - Nectar Cup -- Oral - Nectar Straw -- Oral - Thin Teaspoon -- Oral - Thin Cup WFL Oral - Thin Straw WFL Oral - Puree WFL Oral - Mech Soft -- Oral - Regular Impaired mastication Oral - Multi-Consistency -- Oral - Pill Reduced posterior propulsion Oral Phase - Comment --  CHL IP PHARYNGEAL PHASE 03/28/2017 Pharyngeal Phase Impaired Pharyngeal- Pudding Teaspoon -- Pharyngeal -- Pharyngeal- Pudding Cup -- Pharyngeal -- Pharyngeal- Honey Teaspoon -- Pharyngeal -- Pharyngeal- Honey Cup -- Pharyngeal -- Pharyngeal- Nectar Teaspoon -- Pharyngeal -- Pharyngeal- Nectar Cup -- Pharyngeal -- Pharyngeal- Nectar Straw -- Pharyngeal -- Pharyngeal- Thin Teaspoon -- Pharyngeal -- Pharyngeal- Thin Cup WFL Pharyngeal -- Pharyngeal- Thin Straw Delayed swallow initiation-pyriform sinuses;Penetration/Aspiration during swallow Pharyngeal Material enters airway, remains ABOVE vocal cords then ejected out Pharyngeal- Puree WFL Pharyngeal -- Pharyngeal- Mechanical Soft -- Pharyngeal -- Pharyngeal- Regular WFL Pharyngeal -- Pharyngeal- Multi-consistency -- Pharyngeal -- Pharyngeal- Pill WFL Pharyngeal -- Pharyngeal Comment --  CHL IP CERVICAL ESOPHAGEAL PHASE 03/28/2017 Cervical Esophageal Phase Impaired Pudding Teaspoon -- Pudding Cup -- Honey Teaspoon -- Honey Cup -- Nectar Teaspoon -- Nectar Cup -- Nectar Straw -- Thin Teaspoon -- Thin Cup WFL Thin Straw WFL Puree Esophageal backflow into cervical esophagus Mechanical Soft -- Regular Esophageal backflow into cervical esophagus Multi-consistency -- Pill Esophageal backflow into cervical esophagus Cervical Esophageal Comment -- No flowsheet data found. Information added by Genene Churn, study completed by Blair Heys, SLP PORTER,DABNEY 03/29/2017, 12:54 PM              [place] Ascites (abdomen Limited)  Result Date: 03/27/2017 CLINICAL DATA:  Ascites, history atrial fibrillation, CHF, COPD, hypertension EXAM: LIMITED ABDOMEN ULTRASOUND FOR ASCITES TECHNIQUE: Limited ultrasound  survey for ascites was performed in all four abdominal quadrants. COMPARISON:  Abdomen ultrasound 03/24/2017, CT chest 03/26/2017 FINDINGS: No ascites is sonographically identified. IMPRESSION: No ascites identified. Electronically Signed   By: Lavonia Dana M.D.   On: 03/27/2017 09:34   US Abdomen Limited Ruq  Result Date: 03/24/2017 CLINICAL DATA:  77 year old male with elevated LFTs. Subsequent encounter. EXAM: ULTRASOUND ABDOMEN LIMITED RIGHT UPPER QUADRANT COMPARISON:  03/22/2017 renal sonogram. FINDINGS: Gallbladder: 1.3 cm mobile gallstone. At points gallbladder wall appears thickened measuring up to 5.6 mm. Per ultrasound technologist, patient was not tender over this region during scanning. Common bile duct: Diameter: 4.9 mm Liver: Heterogeneous with minimally lobulated contour raising possibility of cirrhosis with peri hepatic ascites. No dominant liver mass. Portal vein is patent on color Doppler imaging with normal direction of blood flow towards the liver. IMPRESSION: Heterogeneous liver with minimally lobulated contour raising possibility of cirrhosis with peri hepatic ascites. No dominant liver mass. 1.3 cm mobile gallstone. At points gallbladder wall appears thickened measuring up to 5.6 mm. However, per ultrasound technologist, patient was not tender over this region during scanning. These results will be called to the ordering clinician or representative by the Radiologist Assistant,  and communication documented in the PACS or zVision Dashboard. Electronically Signed   By: Genia Del M.D.   On: 03/24/2017 10:03    Orson Eva, DO  [facility] Pager [phone]  If 7PM-7AM, please contact night-coverage [email or web address] Password [email or web address] 03/29/2017, 5:02 PM   LOS: 9 days

## 2017-03-29 NOTE — Progress Notes (Signed)
Physical Therapy Treatment Patient Details Name: Harace Mccluney MRN: [id number] DOB: 1940-05-31 Today's Date: 03/29/2017    History of Present Illness Jagdeep Ancheta is a 77 y.o. male with medical history significant of COPD, CHF, A. fib on Eliquis, hypertension comes in with several days of worsening shortness of breath and cough.  Patient takes care of his wife who has dementia at home and was very reluctant and refusing to come to the hospital according to his daughter because of his concerns with his wife being at home alone.  His daughter arranged for mom to be taking care of and finally convinced him to come to the ED.  Daughter reports that he has been very short of breath since yesterday.  He has been coughing a lot.  Patient denies any fevers or chills.  He has been wheezing at home.  He is a smoker and he does have oxygen as needed at home.  He has not had any nausea vomiting or diarrhea.  He has not been recently hospitalized or on recent antibiotics.  Patient found to have pneumonia in the ED and referred for admission for pneumonia.  Per emergency room physician report patient was stable for MedSurg floor and was in no respiratory distress earlier.  On arrival to [facility] floor however he is in moderate respiratory distress with very diminished breath sounds.  He actually says that he is not short of breath when obviously he is.    PT Comments    Patient was found upright in bedside chair with daughter present. Patient's oxygen saturation was found to be 95% with heart rate at 89 BPM. Patient performed seated exercises with some assistance and verbal cues. Urine was found on floor and therapist called nursing staff to assist with cleaning patient and patient's chair and floor. Nursing staff arrived and assisted. Patient performed sit to stand transfer and stand pivot transfer with minimal assistance. Patient performed ambulation to door and back to bedside chair with minimal assist and use of  rolling walker with slow labored movement and 4 LPM O2 via nasal cannula. Patient returned to bedside chair with chair alarm on and call bell and phone within reach. Patient's daughter was in room. Patient's SpO2 found to be 96%. Patient would continue to benefit from skilled physical therapy in order to address continued deficits in strength, transfers, gait, and overall functional mobility.    Follow Up Recommendations  SNF     Equipment Recommendations  None recommended by PT    Recommendations for Other Services       Precautions / Restrictions Precautions Precautions: Fall Restrictions Weight Bearing Restrictions: No    Mobility  Bed Mobility                  Transfers Overall transfer level: Needs assistance Equipment used: Rolling walker (2 wheeled)(Gait belt; oxygen 4 LPM) Transfers: Sit to/from Stand;Stand Pivot Transfers Sit to Stand: Min assist(From bedside chair increased time) Stand pivot transfers: Min assist       General transfer comment: Patient demonstrated need for increased time with transfers, patient leaned far forward at hips to accomplish sit to stand.   Ambulation/Gait Ambulation/Gait assistance: Min assist Ambulation Distance (Feet): 16 Feet Assistive device: Rolling walker (2 wheeled) Gait Pattern/deviations: Decreased step length - right;Decreased step length - left;Decreased stride length   Gait velocity interpretation: Below normal speed for age/gender General Gait Details: demonstrates slow labored movement with VC's to step closer to RW with fair return, tends to keep trunk  flexed with RW too far in front, limited secondary to fatigue   Stairs            Wheelchair Mobility    Modified Rankin (Stroke Patients Only)       Balance Overall balance assessment: Needs assistance Sitting-balance support: Feet supported;No upper extremity supported Sitting balance-Leahy Scale: Good     Standing balance support: Bilateral  upper extremity supported;During functional activity Standing balance-Leahy Scale: Fair Standing balance comment: Fair using rolling walker                            Cognition Arousal/Alertness: Awake/alert Behavior During Therapy: WFL for tasks assessed/performed Overall Cognitive Status: Within Functional Limits for tasks assessed                                        Exercises General Exercises - Lower Extremity Ankle Circles/Pumps: Seated;AROM;Strengthening;Both;10 reps Long Arc Quad: Seated;AAROM;Strengthening;Both;10 reps Hip Flexion/Marching: Seated;AAROM;Strengthening;10 reps    General Comments        Pertinent Vitals/Pain Pain Assessment: No/denies pain    Home Living                      Prior Function            PT Goals (current goals can now be found in the care plan section) Acute Rehab PT Goals Patient Stated Goal: return home PT Goal Formulation: With patient Time For Goal Achievement: 04/07/17 Potential to Achieve Goals: Fair Progress towards PT goals: Progressing toward goals    Frequency    Min 3X/week      PT Plan Current plan remains appropriate    Co-evaluation              AM-PAC PT "6 Clicks" Daily Activity  Outcome Measure  Difficulty turning over in bed (including adjusting bedclothes, sheets and blankets)?: A Little Difficulty moving from lying on back to sitting on the side of the bed? : A Little Difficulty sitting down on and standing up from a chair with arms (e.g., wheelchair, bedside commode, etc,.)?: A Little Help needed moving to and from a bed to chair (including a wheelchair)?: A Little Help needed walking in hospital room?: A Lot Help needed climbing 3-5 steps with a railing? : Total 6 Click Score: 15    End of Session Equipment Utilized During Treatment: Gait belt;Oxygen(4 LPM O2 via Nasal Cannula) Activity Tolerance: Patient tolerated treatment well;Patient limited by  fatigue Patient left: in chair;with call bell/phone within reach;with chair alarm set;with family/visitor present(Patient's daughter present) Nurse Communication: Mobility status PT Visit Diagnosis: Unsteadiness on feet (R26.81);Other abnormalities of gait and mobility (R26.89);Muscle weakness (generalized) (M62.81)     Time: 1100-1120 PT Time Calculation (min) (ACUTE ONLY): 20 min  Charges:  $Therapeutic Activity: 8-22 mins                    G Codes:      Clarene Critchley PT, DPT 11:43 AM, 03/29/17 [phone]

## 2017-03-30 LAB — HEPATITIS B SURFACE ANTIGEN: Hepatitis B Surface Ag: NEGATIVE

## 2017-03-30 LAB — BASIC METABOLIC PANEL
Anion gap: 13 (ref 5–15)
BUN: 36 mg/dL — ABNORMAL HIGH (ref 6–20)
CHLORIDE: 89 mmol/L — AB (ref 101–111)
CO2: 35 mmol/L — AB (ref 22–32)
CREATININE: 0.61 mg/dL (ref 0.61–1.24)
Calcium: 9.2 mg/dL (ref 8.9–10.3)
GFR calc non Af Amer: >60 mL/min (ref >60)
Glucose, Bld: 100 mg/dL — ABNORMAL HIGH (ref 65–99)
Potassium: 4.1 mmol/L (ref 3.5–5.1)
Sodium: 137 mmol/L (ref 135–145)

## 2017-03-30 LAB — HEPATITIS C ANTIBODY: HCV Ab: <0.1 s/co ratio (ref 0.0–0.9)

## 2017-03-30 LAB — MAGNESIUM: Magnesium: 2 mg/dL (ref 1.7–2.4)

## 2017-03-30 MED ORDER — METOPROLOL TARTRATE 25 MG PO TABS
12.5000 mg | ORAL_TABLET | Freq: Two times a day (BID) | ORAL | Status: DC
  Administered 2017-03-30 – 2017-04-02 (×6): 12.5 mg via ORAL
  Filled 2017-03-30 (×6): qty 1

## 2017-03-30 MED ORDER — DILTIAZEM HCL ER COATED BEADS 240 MG PO CP24
240.0000 mg | ORAL_CAPSULE | Freq: Every day | ORAL | Status: DC
  Administered 2017-03-31 – 2017-04-02 (×3): 240 mg via ORAL
  Filled 2017-03-30 (×3): qty 1

## 2017-03-30 MED ORDER — BUDESONIDE 0.5 MG/2ML IN SUSP
0.5000 mg | Freq: Two times a day (BID) | RESPIRATORY_TRACT | Status: DC
  Administered 2017-03-30 – 2017-04-02 (×7): 0.5 mg via RESPIRATORY_TRACT
  Filled 2017-03-30 (×6): qty 2

## 2017-03-30 NOTE — Progress Notes (Signed)
PROGRESS NOTE  Brett Carey OMV:[id number] DOB: 1940/11/24 DOA: 03/20/2017 PCP: Celene Squibb, MD  Brief History: 77 year old male with a history of essential hypertension, COPD, atrial fibrillation presented with shortness of breath, dyspnea on exertion, and confusion. At the time of admission, the patient was in rate controlled atrial fibrillation. He was started on azithromycin and ceftriaxone. Initial chest x-ray showed vascular congestion with moderate pleural effusion. The patient was given furosemide 80 mg IV x1. The patient was noted to be hypoxic requiring 3 L of oxygen.  Assessment/Plan: Acute respiratory failure with hypoxia -Secondary to pneumonia, CHF and COPD exacerbation -CT chest to clarify pleural effusions vs atelec vs consolidation -03/26/17 CT chest--moderate emphysema. Moderate partially loculated right pleural effusion. Consolidation-RLLsmall perihepatic ascites;  -stable on #L  Community acquired pneumonia/Lobar pneumonia -Initially started on ceftriaxone and azithromycin -changed to amox/clav on 1/29 -d/c abx after 03/27/17 doses--finished 7 days abx  COPD exacerbation -Continue weaning steroids -stable on3L -50 pk years tobacco  Chronic Atrial Fibrillation -continue apixaban -add metoprolol 12.5 mg bid -increasediltiazem 60 mg po q 6 hrs  Acute onChronic diastolic CHF -check GGE--366 -Echo--EF 65-70%, no WMA, PASP 38, mild to moderate TR -NEg 8 L -increasedIV furosemide to bid -fluid restrict  Pleural Effusion -03/26/17 CT chest--moderate emphysema. Moderate partially loculated right pleural effusion. Consolidation-RLLsmall perihepatic ascites;  -May be parapneumonic versus CHF -attempted thora--complex fluid in chest, insufficient to perform  Dysphagia -MBS--reveals mostly esophageal issue -dysphagia 2 with thin liquids -not a candidate for EGD presently due to respiratory status -esophagram if  possible  AKI -due to infectious proceess -baseline creatinine 0.8-1.0 -serum creatinine peaked 1.86 -improved  Transaminasemia -due to hypotension -improving -1/28 RUQ US--heterogenous liver concerning for cirrhosis with perihepatic ascites;cholelithiasis with gallbladder wall thickening; no ductal dilatation -no abdominal pain  Liver Cirrhosiswith ascites -noted on abdominal imaging -likely due Etoh -hep B surface antigen--neg -hep c antibody--neg -attemptedparacentesis-->not enough fluid  Moderate malnutrition -continue supplements  Hypomagnesemia -repleted   Disposition Plan:SNFwhen stable Family Communication:Daughter updatedat bedside2/1   Consultants:none  Code Status: FULL   DVT Prophylaxis:apixaban   Procedures: As Listed in Progress Note Above  Antibiotics: Ceftriaxone 1/24>>>1/28 azithro 1/24>>>1/28 Amox/clav 1/29>>>2/1      Subjective: Patient denies fevers, chills, headache, chest pain, dyspnea, nausea, vomiting, diarrhea, abdominal pain, dysuria, hematuria, hematochezia, and melena.   Objective: Vitals:   03/30/17 0850 03/30/17 1205 03/30/17 1400 03/30/17 1407  BP:  116/85 121/63   Pulse:  86 73   Resp:   18   Temp:   98 F (36.7 C)   TempSrc:   Oral   SpO2: 90% 100% 94% 90%  Weight:      Height:        Intake/Output Summary (Last 24 hours) at 03/30/2017 1618 Last data filed at 03/30/2017 1200 Gross per 24 hour  Intake 650 ml  Output 2100 ml  Net -1450 ml   Weight change: -0.5 kg (-1.6 oz) Exam:   General:  Pt is alert, follows commands appropriately, not in acute distress  HEENT: No icterus, No thrush, No neck mass, [place]/AT  Cardiovascular: RRR, S1/S2, no rubs, no gallops  Respiratory: Bibasilar rales.  No wheezing.  Good air movement  Abdomen: Soft/+BS, non tender, non distended, no guarding  Extremities: 1+LE edema, No lymphangitis, No petechiae, No rashes, no synovitis   Data  Reviewed: I have personally reviewed following labs and imaging studies Basic Metabolic Panel: Recent Labs  Lab 03/26/17 0455 03/27/17  2202 03/28/17 0544 03/29/17 0732 03/30/17 0646  NA 137 137 137 138 137  K 4.5 4.7 4.6 4.1 4.1  CL 100* 97* 92* 92* 89*  CO2 27 29 33* 35* 35*  GLUCOSE 104* 138* 86 122* 100*  BUN 45* 44* 40* 35* 36*  CREATININE 0.72 0.75 0.69 0.66 0.61  CALCIUM 9.0 9.4 9.4 9.1 9.2  MG  --   --  1.6* 1.6* 2.0   Liver Function Tests: Recent Labs  Lab 03/24/17 0453 03/25/17 0407 03/26/17 0455 03/27/17 0459 03/29/17 0732  AST 183* 109* 108* 80* 48*  ALT 320* 260* 235* 199* 127*  ALKPHOS 62 64 67 71 63  BILITOT 2.1* 2.3* 2.7* 2.9* 2.9*  PROT 5.8* 5.8* 5.8* 6.0* 5.7*  ALBUMIN 3.0* 3.1* 3.1* 3.2* 3.2*   No results for input(s): LIPASE, AMYLASE in the last 168 hours. No results for input(s): AMMONIA in the last 168 hours. Coagulation Profile: No results for input(s): INR, PROTIME in the last 168 hours. CBC: Recent Labs  Lab 03/26/17 0455 03/27/17 0459 03/28/17 0544  WBC 13.8* 13.5* 14.7*  HGB 16.0 16.3 16.5  HCT 50.6 51.4 51.4  MCV 97.3 97.5 97.7  PLT 86* 80* 71*   Cardiac Enzymes: No results for input(s): CKTOTAL, CKMB, CKMBINDEX, TROPONINI in the last 168 hours. BNP: Invalid input(s): POCBNP CBG: No results for input(s): GLUCAP in the last 168 hours. HbA1C: No results for input(s): HGBA1C in the last 72 hours. Urine analysis:    Component Value Date/Time   COLORURINE AMBER (A) 03/20/2017 1900   APPEARANCEUR HAZY (A) 03/20/2017 1900   LABSPEC 1.025 03/20/2017 1900   PHURINE 5.0 03/20/2017 1900   GLUCOSEU NEGATIVE 03/20/2017 1900   HGBUR NEGATIVE 03/20/2017 1900   BILIRUBINUR SMALL (A) 03/20/2017 1900   KETONESUR NEGATIVE 03/20/2017 1900   PROTEINUR 100 (A) 03/20/2017 1900   NITRITE NEGATIVE 03/20/2017 1900   LEUKOCYTESUR NEGATIVE 03/20/2017 1900   Sepsis Labs: @LABRCNTIP (procalcitonin:4,lacticidven:4) ) Recent Results (from the  past 240 hour(s))  Culture, blood (Routine x 2)     Status: None   Collection Time: 03/20/17  7:09 PM  Result Value Ref Range Status   Specimen Description RIGHT ANTECUBITAL  Final   Special Requests   Final    BOTTLES DRAWN AEROBIC ONLY Blood Culture adequate volume   Culture NO GROWTH 5 DAYS  Final   Report Status 03/25/2017 FINAL  Final  Culture, blood (Routine x 2)     Status: None   Collection Time: 03/20/17  7:09 PM  Result Value Ref Range Status   Specimen Description BLOOD LEFT HAND  Final   Special Requests   Final    BOTTLES DRAWN AEROBIC ONLY Blood Culture adequate volume   Culture NO GROWTH 5 DAYS  Final   Report Status 03/25/2017 FINAL  Final  MRSA PCR Screening     Status: None   Collection Time: 03/21/17  1:27 AM  Result Value Ref Range Status   MRSA by PCR NEGATIVE NEGATIVE Final    Comment:        The GeneXpert MRSA Assay (FDA approved for NASAL specimens only), is one component of a comprehensive MRSA colonization surveillance program. It is not intended to diagnose MRSA infection nor to guide or monitor treatment for MRSA infections.      Scheduled Meds: . apixaban  5 mg Oral BID  . budesonide (PULMICORT) nebulizer solution  0.5 mg Nebulization BID  . diltiazem  60 mg Oral Q6H  . feeding supplement (ENSURE ENLIVE)  237 mL Oral BID BM  . furosemide  40 mg Intravenous BID  . guaiFENesin  1,200 mg Oral BID  . HYDROcodone-acetaminophen  1 tablet Oral Q8H  . ipratropium-albuterol  3 mL Nebulization TID  . mouth rinse  15 mL Mouth Rinse BID  . metoprolol tartrate  12.5 mg Oral BID  . polyethylene glycol  17 g Oral Daily  . predniSONE  30 mg Oral Q breakfast  . sodium chloride flush  3 mL Intravenous Q12H   Continuous Infusions: . sodium chloride      Procedures/Studies: Dg Chest 2 View  Result Date: 03/20/2017 CLINICAL DATA:  Generalize weakness with decreased appetite and short of breath EXAM: CHEST  2 VIEW COMPARISON:  01/04/2014 FINDINGS:  Suspected moderate right pleural effusion. Cardiomegaly with vascular congestion and mild pulmonary edema. Aortic atherosclerosis. No pneumothorax. Mild degenerative changes of the spine. Airspace disease at the right middle lobe and lung base. IMPRESSION: 1. Cardiomegaly with vascular congestion and mild pulmonary edema. Suspected moderate right pleural effusion 2. Airspace disease at the right middle lobe and lung base could reflect superimposed pneumonia Electronically Signed   By: Donavan Foil M.D.   On: 03/20/2017 19:07   Ct Chest Wo Contrast  Result Date: 03/26/2017 CLINICAL DATA:  COPD and hypoxia.  Evaluate effusions. EXAM: CT CHEST WITHOUT CONTRAST TECHNIQUE: Multidetector CT imaging of the chest was performed following the standard protocol without IV contrast. COMPARISON:  Chest radiograph from 03/20/2017 FINDINGS: Cardiovascular: There is moderate to marked cardiac enlargement. No pericardial effusion. Aortic atherosclerosis. Calcifications are noted within the LAD Coronary artery. Mediastinum/Nodes: The trachea is patent and midline. Normal appearance of the esophagus. No mediastinal or hilar adenopathy. No axillary or supraclavicular adenopathy. Lungs/Pleura: Moderate changes of emphysema. Small left pleural effusion with overlying subpleural atelectasis. There is a moderate volume partially loculated right pleural effusion. There is compressive type atelectasis and consolidation within the right lower lobe. Complete atelectasis/consolidation of the right middle lobe identified. Upper Abdomen: There is a small volume of perihepatic ascites. Low-attenuation left adrenal nodule measures 2 cm and likely represents a benign adenoma. Musculoskeletal: There is degenerative disc disease noted within the thoracic spine. No aggressive lytic or sclerotic bone lesions. IMPRESSION: 1. Moderate partially loculated right pleural effusion with complete atelectasis/consolidation of the right middle lobe. Followup  imaging is advised to ensure re-expansion of the right middle lobe. If this does not improve then consider further evaluation with either bronchoscopy or PET-CT to assess for underlying lung lesion. 2. Small left pleural effusion. 3. Marked cardiac enlargement, aortic atherosclerosis and LAD coronary artery calcifications. Aortic Atherosclerosis (ICD10-I70.0). 4. Small volume of ascites within the right upper quadrant of the abdomen. 5. Diffuse bronchial wall thickening with emphysema, as above; imaging findings suggestive of underlying COPD. Emphysema (ICD10-J43.9). Electronically Signed   By: Kerby Moors M.D.   On: 03/26/2017 19:28   [place] Chest (pleural Effusion)  Result Date: 03/27/2017 CLINICAL DATA:  RIGHT pleural effusion, for thoracentesis EXAM: CHEST ULTRASOUND COMPARISON:  Chest CT 03/26/2017 FINDINGS: Sonography of the RIGHT hemithorax was performed. A thin rim of complicated fluid is seen surrounding the RIGHT lung base, containing scattered internal echoes. Volume of fluid visualized is insufficient for thoracentesis. IMPRESSION: Complex appearing fluid at the inferior RIGHT hemithorax, insufficient volume for thoracentesis. Electronically Signed   By: Lavonia Dana M.D.   On: 03/27/2017 12:51   US Renal  Result Date: 03/22/2017 CLINICAL DATA:  Hypertension.  Acute kidney injury. EXAM: RENAL / URINARY TRACT ULTRASOUND COMPLETE COMPARISON:  None. FINDINGS: Right Kidney: Length: 10.9 cm. Mild cortical thinning. No hydronephrosis. 2 cm cyst in the lower pole. Left Kidney: Length: 11.0 cm. Cortical thickness appears minimally thin end. Multiple cysts, the largest measuring 1.8 cm in the midportion. Bladder: Foley catheter in place. Some ascites is noted.  Probable cirrhosis of the liver. IMPRESSION: Kidneys are normal in size but show mild cortical thinning, right more than left. No hydronephrosis or significant focal lesion. Benign appearing simple cysts. Foley catheter in the bladder. Probable  cirrhosis and ascites. Electronically Signed   By: Nelson Chimes M.D.   On: 03/22/2017 14:26   Dg Swallowing Func-speech Pathology  Result Date: 03/29/2017 Objective Swallowing Evaluation: Type of Study: MBS-Modified Barium Swallow Study  Patient Details Name: Brett Carey MRN: [id number] Date of Birth: 12/17/1940 Today's Date: 03/29/2017 Time: SLP Start Time (ACUTE ONLY): 1620 -SLP Stop Time (ACUTE ONLY): 1652 SLP Time Calculation (min) (ACUTE ONLY): 32 min Past Medical History: Past Medical History: Diagnosis Date . Atrial fibrillation with RVR ([place]) 01/04/2014 . CHF (congestive heart failure) ([place])  . COPD (chronic obstructive pulmonary disease) ([place])  . Gout  . History of hiatal hernia  . Hypertension  . Pneumonia X 2 Past Surgical History: Past Surgical History: Procedure Laterality Date . BACK SURGERY   . CATARACT EXTRACTION W/ INTRAOCULAR LENS  IMPLANT, BILATERAL Bilateral 2000's . LUMBAR DISC SURGERY   HPI: 77 year old male with a history of COPD, atrial fibrillation, presented to the hospital with progressive shortness of breath.  Found to have COPD exacerbation as well as community-acquired pneumonia.  He had respiratory failure requiring BiPAP therapy, now on nasal cannula.  He has been treated with steroids, nebs and antibiotics. Hospital course complicated by development of AKI due to hypotension, which has since improved. He is slowly improving. Swallow evaluation pending for dysphagia. Will need SNF placement on discharge. Anticipate discharge in the next 1-2 days.  Subjective: "He says food won't go down." Assessment / Plan / Recommendation CHL IP CLINICAL IMPRESSIONS 03/28/2017 Clinical Impression Pt currently with what is suspected to be a primary esophageal dysphagia. Pt did have a delay in swallow initiation and flash penetration x1 of thin liquids which was not considered abnormal. Of concern was esophageal sweep when pt swallowed barium tablet and esophagus was nearly full of barium material;  pill eventually cleared to stomach with numerous follow-up boluses of puree to push it through. Pt is at an increased risk of aspiration due to esophageal function. Recommend dysphagia 2/ thin liquids, meds crushed in puree, supervision to assist with feeding, with strict esophageal precautions- sitting upright 90 degrees, small bites/ sips, alternate food/ liquid, have pt sit upright at least an hour after meal before reclining. Pt also may benefit from more frequent, smaller meals. Would also highly recommend GI consult to further assess esophageal function. Will f/u to to ensure diet tolerance/ review compensatory strategies.  SLP Visit Diagnosis Dysphagia, pharyngoesophageal phase (R13.14) Attention and concentration deficit following -- Frontal lobe and executive function deficit following -- Impact on safety and function Moderate aspiration risk   CHL IP TREATMENT RECOMMENDATION 03/28/2017 Treatment Recommendations Therapy as outlined in treatment plan below   Prognosis 03/28/2017 Prognosis for Safe Diet Advancement Fair Barriers to Reach Goals Other (Comment) Barriers/Prognosis Comment -- CHL IP DIET RECOMMENDATION 03/28/2017 SLP Diet Recommendations Dysphagia 2 (Fine chop) solids;Thin liquid Liquid Administration via Cup;Straw Medication Administration Crushed with puree Compensations Slow rate;Small sips/bites;Follow solids with liquid Postural Changes Remain semi-upright after after feeds/meals (Comment);Seated upright at 90 degrees  CHL IP OTHER RECOMMENDATIONS 03/28/2017 Recommended Consults Consider esophageal assessment Oral Care Recommendations Oral care BID Other Recommendations Clarify dietary restrictions   CHL IP FOLLOW UP RECOMMENDATIONS 03/28/2017 Follow up Recommendations Skilled Nursing facility   [facility] IP FREQUENCY AND DURATION 03/28/2017 Speech Therapy Frequency (ACUTE ONLY) min 1 x/week Treatment Duration 1 week      CHL IP ORAL PHASE 03/28/2017 Oral Phase Impaired Oral - Pudding Teaspoon -- Oral -  Pudding Cup -- Oral - Honey Teaspoon -- Oral - Honey Cup -- Oral - Nectar Teaspoon -- Oral - Nectar Cup -- Oral - Nectar Straw -- Oral - Thin Teaspoon -- Oral - Thin Cup WFL Oral - Thin Straw WFL Oral - Puree WFL Oral - Mech Soft -- Oral - Regular Impaired mastication Oral - Multi-Consistency -- Oral - Pill Reduced posterior propulsion Oral Phase - Comment --  CHL IP PHARYNGEAL PHASE 03/28/2017 Pharyngeal Phase Impaired Pharyngeal- Pudding Teaspoon -- Pharyngeal -- Pharyngeal- Pudding Cup -- Pharyngeal -- Pharyngeal- Honey Teaspoon -- Pharyngeal -- Pharyngeal- Honey Cup -- Pharyngeal -- Pharyngeal- Nectar Teaspoon -- Pharyngeal -- Pharyngeal- Nectar Cup -- Pharyngeal -- Pharyngeal- Nectar Straw -- Pharyngeal -- Pharyngeal- Thin Teaspoon -- Pharyngeal -- Pharyngeal- Thin Cup WFL Pharyngeal -- Pharyngeal- Thin Straw Delayed swallow initiation-pyriform sinuses;Penetration/Aspiration during swallow Pharyngeal Material enters airway, remains ABOVE vocal cords then ejected out Pharyngeal- Puree WFL Pharyngeal -- Pharyngeal- Mechanical Soft -- Pharyngeal -- Pharyngeal- Regular WFL Pharyngeal -- Pharyngeal- Multi-consistency -- Pharyngeal -- Pharyngeal- Pill WFL Pharyngeal -- Pharyngeal Comment --  CHL IP CERVICAL ESOPHAGEAL PHASE 03/28/2017 Cervical Esophageal Phase Impaired Pudding Teaspoon -- Pudding Cup -- Honey Teaspoon -- Honey Cup -- Nectar Teaspoon -- Nectar Cup -- Nectar Straw -- Thin Teaspoon -- Thin Cup WFL Thin Straw WFL Puree Esophageal backflow into cervical esophagus Mechanical Soft -- Regular Esophageal backflow into cervical esophagus Multi-consistency -- Pill Esophageal backflow into cervical esophagus Cervical Esophageal Comment -- No flowsheet data found. Information added by Genene Churn, study completed by Blair Heys, SLP PORTER,DABNEY 03/29/2017, 12:54 PM              [place] Ascites (abdomen Limited)  Result Date: 03/27/2017 CLINICAL DATA:  Ascites, history atrial fibrillation, CHF, COPD, hypertension  EXAM: LIMITED ABDOMEN ULTRASOUND FOR ASCITES TECHNIQUE: Limited ultrasound survey for ascites was performed in all four abdominal quadrants. COMPARISON:  Abdomen ultrasound 03/24/2017, CT chest 03/26/2017 FINDINGS: No ascites is sonographically identified. IMPRESSION: No ascites identified. Electronically Signed   By: Lavonia Dana M.D.   On: 03/27/2017 09:34   US Abdomen Limited Ruq  Result Date: 03/24/2017 CLINICAL DATA:  77 year old male with elevated LFTs. Subsequent encounter. EXAM: ULTRASOUND ABDOMEN LIMITED RIGHT UPPER QUADRANT COMPARISON:  03/22/2017 renal sonogram. FINDINGS: Gallbladder: 1.3 cm mobile gallstone. At points gallbladder wall appears thickened measuring up to 5.6 mm. Per ultrasound technologist, patient was not tender over this region during scanning. Common bile duct: Diameter: 4.9 mm Liver: Heterogeneous with minimally lobulated contour raising possibility of cirrhosis with peri hepatic ascites. No dominant liver mass. Portal vein is patent on color Doppler imaging with normal direction of blood flow towards the liver. IMPRESSION: Heterogeneous liver with minimally lobulated contour raising possibility of cirrhosis with peri hepatic ascites. No dominant liver mass. 1.3 cm mobile gallstone. At points gallbladder wall appears thickened measuring up to 5.6 mm. However, per ultrasound technologist, patient was not tender over this region during scanning. These results will be called to the ordering clinician or representative by the Radiologist Assistant, and communication documented in the PACS  or zVision Dashboard. Electronically Signed   By: Genia Del M.D.   On: 03/24/2017 10:03    Orson Eva, DO  [facility] Pager [phone]  If 7PM-7AM, please contact night-coverage [email or web address] Password [email or web address] 03/30/2017, 4:18 PM   LOS: 10 days

## 2017-03-31 LAB — BASIC METABOLIC PANEL
ANION GAP: 12 (ref 5–15)
BUN: 41 mg/dL — ABNORMAL HIGH (ref 6–20)
CO2: 35 mmol/L — ABNORMAL HIGH (ref 22–32)
Calcium: 9.2 mg/dL (ref 8.9–10.3)
Chloride: 88 mmol/L — ABNORMAL LOW (ref 101–111)
Creatinine, Ser: 0.64 mg/dL (ref 0.61–1.24)
Glucose, Bld: 92 mg/dL (ref 65–99)
POTASSIUM: 3.7 mmol/L (ref 3.5–5.1)
Sodium: 135 mmol/L (ref 135–145)

## 2017-03-31 LAB — BRAIN NATRIURETIC PEPTIDE: B NATRIURETIC PEPTIDE 5: 216 pg/mL — AB (ref 0.0–100.0)

## 2017-03-31 MED ORDER — FUROSEMIDE 10 MG/ML IJ SOLN
40.0000 mg | Freq: Three times a day (TID) | INTRAMUSCULAR | Status: AC
  Administered 2017-03-31 – 2017-04-01 (×5): 40 mg via INTRAVENOUS
  Filled 2017-03-31 (×5): qty 4

## 2017-03-31 NOTE — Progress Notes (Signed)
This RN changed IV drsg. Last night d/t IV drsg. over 2 days old. When old IV drsg was removed, blood seeped from skin surrounding IV site. Area cleansed, and new IV drsg applied. Bleeding has stopped, but there is bloody drng. Under IV drsg. IV drsg left in place and not changed d/t skin trauma from IV drsg. Change last night. Will pass on to day shift RN. Will continue to monitor.

## 2017-03-31 NOTE — Clinical Social Work Note (Signed)
LCSW following. Pt was not stable for dc over the weekend. Contacted Michelle at [employer] to update. Sharyn Lull will follow up with pt's insurance to provide updated clinical and obtain authorization for pt to admit to them in the next day or two.   Will follow up tomorrow.

## 2017-03-31 NOTE — Progress Notes (Signed)
Per tele, patient had 15 beats of V-Tach. Rhythm returned to A-fib. Patient asymptomatic. Dr. Manuella Ghazi notified.

## 2017-03-31 NOTE — Progress Notes (Signed)
SLP Cancellation Note  Patient Details Name: Brett Carey MRN: [id number] DOB: 1940-04-09   Cancelled treatment:       Reason Eval/Treat Not Completed: Other (comment); MBS completed Friday and Pt now tolerating diet well. SLP will sign off. Please re-consult if indicated.  Thank you,  Genene Churn, [place]    [place] 03/31/2017, 7:26 PM

## 2017-03-31 NOTE — Progress Notes (Signed)
PROGRESS NOTE  Brett Carey JAS:[id number] DOB: [birth year] DOA: 03/20/2017 PCP: Celene Squibb, MD  Brief History: 77 year old male with a history of essential hypertension, COPD, atrial fibrillation presented with shortness of breath, dyspnea on exertion, and confusion. At the time of admission, the patient was in rate controlled atrial fibrillation. He was started on azithromycin and ceftriaxone. Initial chest x-ray showed vascular congestion with moderate pleural effusion. The patient was given furosemide 80 mg IV x1. The patient was noted to be hypoxic requiring 3 L of oxygen.  Assessment/Plan: Acute respiratory failure with hypoxia -Secondary to pneumonia, CHFand COPD exacerbation -CT chest to clarify pleural effusions vs atelec vs consolidation -03/26/17 CT chest--moderate emphysema. Moderate partially loculated right pleural effusion. Consolidation-RLLsmall perihepatic ascites;  -stable on 3L  Community acquired pneumonia/Lobar pneumonia -Initially started on ceftriaxone and azithromycin -changed to amox/clav on 1/29 -d/c abx after 03/27/17 doses--finished 7 days abx  COPD exacerbation -Continue weaning steroids -stable on3L -50 pk years tobacco  Chronic Atrial Fibrillation -continue apixaban -add metoprolol 12.5 mg bid -increasediltiazem60 mg po q 6 hrs -now rate controlled  Acute onChronic diastolic CHF -check ALP--379 -Echo--EF 65-70%, no WMA, PASP 38, mild to moderate TR -NEG 9.9 L -increasedIV furosemideto tid -fluid restrict  Pleural Effusion -03/26/17 CT chest--moderate emphysema. Moderate partially loculated right pleural effusion. Consolidation-RLLsmall perihepatic ascites;  -May be parapneumonic versus CHF -attempted thora--complex fluid in chest, insufficient to perform  Dysphagia -MBS--reveals mostly esophageal issue -dysphagia 2 with thin liquids -not a candidate for EGD presently due to respiratory status -now  swallowing without difficulty  AKI -due to infectious proceess -baseline creatinine 0.8-1.0 -serum creatinine peaked 1.86 -improved  Transaminasemia -due to hypotension -improving -1/28 RUQ US--heterogenous liver concerning for cirrhosis with perihepatic ascites;cholelithiasis with gallbladder wall thickening; no ductal dilatation -no abdominal pain  Liver Cirrhosiswith ascites -noted on abdominal imaging -likely due Etoh -hep B surface antigen--neg -hep c antibody--neg -attemptedparacentesis-->not enough fluid  Moderate malnutrition -continue supplements  Hypomagnesemia -repleted   Disposition Plan:SNFwhen stable Family Communication:Daughter updatedat bedside2/4   Consultants:none  Code Status: FULL   DVT Prophylaxis:apixaban   Procedures: As Listed in Progress Note Above  Antibiotics: Ceftriaxone 1/24>>>1/28 azithro 1/24>>>1/28 Amox/clav 1/29>>>2/1     Subjective: Patient denies fevers, chills, headache, chest pain, dyspnea, nausea, vomiting, diarrhea, abdominal pain, dysuria, hematuria, hematochezia, and melena.   Objective: Vitals:   03/30/17 2320 03/31/17 0431 03/31/17 0741 03/31/17 1501  BP: 101/74 104/84    Pulse: 87 84    Resp:      Temp:  98 F (36.7 C)    TempSrc:  Oral    SpO2: 93% 94% 92% 93%  Weight:  94.7 kg (208 lb 12.4 oz)    Height:        Intake/Output Summary (Last 24 hours) at 03/31/2017 1858 Last data filed at 03/31/2017 1002 Gross per 24 hour  Intake 528 ml  Output 750 ml  Net -222 ml   Weight change: -0.7 kg (-8.7 oz) Exam:   General:  Pt is alert, follows commands appropriately, not in acute distress  HEENT: No icterus, No thrush, No neck mass, [place]/AT  Cardiovascular: RRR, S1/S2, no rubs, no gallops  Respiratory: bibasilar crackles ,no wheeze  Abdomen: Soft/+BS, non tender, non distended, no guarding  Extremities: 2 + LE edema, No lymphangitis, No petechiae, No rashes, no  synovitis   Data Reviewed: I have personally reviewed following labs and imaging studies Basic Metabolic Panel: Recent Labs  Lab 03/27/17  1610 03/28/17 0544 03/29/17 0732 03/30/17 0646 03/31/17 0547  NA 137 137 138 137 135  K 4.7 4.6 4.1 4.1 3.7  CL 97* 92* 92* 89* 88*  CO2 29 33* 35* 35* 35*  GLUCOSE 138* 86 122* 100* 92  BUN 44* 40* 35* 36* 41*  CREATININE 0.75 0.69 0.66 0.61 0.64  CALCIUM 9.4 9.4 9.1 9.2 9.2  MG  --  1.6* 1.6* 2.0  --    Liver Function Tests: Recent Labs  Lab 03/25/17 0407 03/26/17 0455 03/27/17 0459 03/29/17 0732  AST 109* 108* 80* 48*  ALT 260* 235* 199* 127*  ALKPHOS 64 67 71 63  BILITOT 2.3* 2.7* 2.9* 2.9*  PROT 5.8* 5.8* 6.0* 5.7*  ALBUMIN 3.1* 3.1* 3.2* 3.2*   No results for input(s): LIPASE, AMYLASE in the last 168 hours. No results for input(s): AMMONIA in the last 168 hours. Coagulation Profile: No results for input(s): INR, PROTIME in the last 168 hours. CBC: Recent Labs  Lab 03/26/17 0455 03/27/17 0459 03/28/17 0544  WBC 13.8* 13.5* 14.7*  HGB 16.0 16.3 16.5  HCT 50.6 51.4 51.4  MCV 97.3 97.5 97.7  PLT 86* 80* 71*   Cardiac Enzymes: No results for input(s): CKTOTAL, CKMB, CKMBINDEX, TROPONINI in the last 168 hours. BNP: Invalid input(s): POCBNP CBG: No results for input(s): GLUCAP in the last 168 hours. HbA1C: No results for input(s): HGBA1C in the last 72 hours. Urine analysis:    Component Value Date/Time   COLORURINE AMBER (A) 03/20/2017 1900   APPEARANCEUR HAZY (A) 03/20/2017 1900   LABSPEC 1.025 03/20/2017 1900   PHURINE 5.0 03/20/2017 1900   GLUCOSEU NEGATIVE 03/20/2017 1900   HGBUR NEGATIVE 03/20/2017 1900   BILIRUBINUR SMALL (A) 03/20/2017 1900   KETONESUR NEGATIVE 03/20/2017 1900   PROTEINUR 100 (A) 03/20/2017 1900   NITRITE NEGATIVE 03/20/2017 1900   LEUKOCYTESUR NEGATIVE 03/20/2017 1900   Sepsis Labs: @LABRCNTIP (procalcitonin:4,lacticidven:4) )No results found for this or any previous visit (from  the past 240 hour(s)).   Scheduled Meds: . apixaban  5 mg Oral BID  . budesonide (PULMICORT) nebulizer solution  0.5 mg Nebulization BID  . diltiazem  240 mg Oral Daily  . feeding supplement (ENSURE ENLIVE)  237 mL Oral BID BM  . furosemide  40 mg Intravenous Q8H  . guaiFENesin  1,200 mg Oral BID  . HYDROcodone-acetaminophen  1 tablet Oral Q8H  . ipratropium-albuterol  3 mL Nebulization TID  . mouth rinse  15 mL Mouth Rinse BID  . metoprolol tartrate  12.5 mg Oral BID  . polyethylene glycol  17 g Oral Daily  . predniSONE  30 mg Oral Q breakfast  . sodium chloride flush  3 mL Intravenous Q12H   Continuous Infusions: . sodium chloride      Procedures/Studies: Dg Chest 2 View  Result Date: 03/20/2017 CLINICAL DATA:  Generalize weakness with decreased appetite and short of breath EXAM: CHEST  2 VIEW COMPARISON:  01/04/2014 FINDINGS: Suspected moderate right pleural effusion. Cardiomegaly with vascular congestion and mild pulmonary edema. Aortic atherosclerosis. No pneumothorax. Mild degenerative changes of the spine. Airspace disease at the right middle lobe and lung base. IMPRESSION: 1. Cardiomegaly with vascular congestion and mild pulmonary edema. Suspected moderate right pleural effusion 2. Airspace disease at the right middle lobe and lung base could reflect superimposed pneumonia Electronically Signed   By: Donavan Foil M.D.   On: 03/20/2017 19:07   Ct Chest Wo Contrast  Result Date: 03/26/2017 CLINICAL DATA:  COPD and hypoxia.  Evaluate  effusions. EXAM: CT CHEST WITHOUT CONTRAST TECHNIQUE: Multidetector CT imaging of the chest was performed following the standard protocol without IV contrast. COMPARISON:  Chest radiograph from 03/20/2017 FINDINGS: Cardiovascular: There is moderate to marked cardiac enlargement. No pericardial effusion. Aortic atherosclerosis. Calcifications are noted within the LAD Coronary artery. Mediastinum/Nodes: The trachea is patent and midline. Normal  appearance of the esophagus. No mediastinal or hilar adenopathy. No axillary or supraclavicular adenopathy. Lungs/Pleura: Moderate changes of emphysema. Small left pleural effusion with overlying subpleural atelectasis. There is a moderate volume partially loculated right pleural effusion. There is compressive type atelectasis and consolidation within the right lower lobe. Complete atelectasis/consolidation of the right middle lobe identified. Upper Abdomen: There is a small volume of perihepatic ascites. Low-attenuation left adrenal nodule measures 2 cm and likely represents a benign adenoma. Musculoskeletal: There is degenerative disc disease noted within the thoracic spine. No aggressive lytic or sclerotic bone lesions. IMPRESSION: 1. Moderate partially loculated right pleural effusion with complete atelectasis/consolidation of the right middle lobe. Followup imaging is advised to ensure re-expansion of the right middle lobe. If this does not improve then consider further evaluation with either bronchoscopy or PET-CT to assess for underlying lung lesion. 2. Small left pleural effusion. 3. Marked cardiac enlargement, aortic atherosclerosis and LAD coronary artery calcifications. Aortic Atherosclerosis (ICD10-I70.0). 4. Small volume of ascites within the right upper quadrant of the abdomen. 5. Diffuse bronchial wall thickening with emphysema, as above; imaging findings suggestive of underlying COPD. Emphysema (ICD10-J43.9). Electronically Signed   By: Kerby Moors M.D.   On: 03/26/2017 19:28   [place] Chest (pleural Effusion)  Result Date: 03/27/2017 CLINICAL DATA:  RIGHT pleural effusion, for thoracentesis EXAM: CHEST ULTRASOUND COMPARISON:  Chest CT 03/26/2017 FINDINGS: Sonography of the RIGHT hemithorax was performed. A thin rim of complicated fluid is seen surrounding the RIGHT lung base, containing scattered internal echoes. Volume of fluid visualized is insufficient for thoracentesis. IMPRESSION: Complex  appearing fluid at the inferior RIGHT hemithorax, insufficient volume for thoracentesis. Electronically Signed   By: Lavonia Dana M.D.   On: 03/27/2017 12:51   US Renal  Result Date: 03/22/2017 CLINICAL DATA:  Hypertension.  Acute kidney injury. EXAM: RENAL / URINARY TRACT ULTRASOUND COMPLETE COMPARISON:  None. FINDINGS: Right Kidney: Length: 10.9 cm. Mild cortical thinning. No hydronephrosis. 2 cm cyst in the lower pole. Left Kidney: Length: 11.0 cm. Cortical thickness appears minimally thin end. Multiple cysts, the largest measuring 1.8 cm in the midportion. Bladder: Foley catheter in place. Some ascites is noted.  Probable cirrhosis of the liver. IMPRESSION: Kidneys are normal in size but show mild cortical thinning, right more than left. No hydronephrosis or significant focal lesion. Benign appearing simple cysts. Foley catheter in the bladder. Probable cirrhosis and ascites. Electronically Signed   By: Nelson Chimes M.D.   On: 03/22/2017 14:26   Dg Swallowing Func-speech Pathology  Result Date: 03/29/2017 Objective Swallowing Evaluation: Type of Study: MBS-Modified Barium Swallow Study  Patient Details Name: Brett Carey MRN: [id number] Date of Birth: [birth year] Today's Date: 03/29/2017 Time: SLP Start Time (ACUTE ONLY): 1620 -SLP Stop Time (ACUTE ONLY): 1652 SLP Time Calculation (min) (ACUTE ONLY): 32 min Past Medical History: Past Medical History: Diagnosis Date . Atrial fibrillation with RVR ([place]) 01/04/2014 . CHF (congestive heart failure) ([place])  . COPD (chronic obstructive pulmonary disease) ([place])  . Gout  . History of hiatal hernia  . Hypertension  . Pneumonia X 2 Past Surgical History: Past Surgical History: Procedure Laterality Date . BACK SURGERY   .  CATARACT EXTRACTION W/ INTRAOCULAR LENS  IMPLANT, BILATERAL Bilateral 2000's . LUMBAR DISC SURGERY   HPI: 77 year old male with a history of COPD, atrial fibrillation, presented to the hospital with progressive shortness of breath.  Found to have COPD  exacerbation as well as community-acquired pneumonia.  He had respiratory failure requiring BiPAP therapy, now on nasal cannula.  He has been treated with steroids, nebs and antibiotics. Hospital course complicated by development of AKI due to hypotension, which has since improved. He is slowly improving. Swallow evaluation pending for dysphagia. Will need SNF placement on discharge. Anticipate discharge in the next 1-2 days.  Subjective: "He says food won't go down." Assessment / Plan / Recommendation CHL IP CLINICAL IMPRESSIONS 03/28/2017 Clinical Impression Pt currently with what is suspected to be a primary esophageal dysphagia. Pt did have a delay in swallow initiation and flash penetration x1 of thin liquids which was not considered abnormal. Of concern was esophageal sweep when pt swallowed barium tablet and esophagus was nearly full of barium material; pill eventually cleared to stomach with numerous follow-up boluses of puree to push it through. Pt is at an increased risk of aspiration due to esophageal function. Recommend dysphagia 2/ thin liquids, meds crushed in puree, supervision to assist with feeding, with strict esophageal precautions- sitting upright 90 degrees, small bites/ sips, alternate food/ liquid, have pt sit upright at least an hour after meal before reclining. Pt also may benefit from more frequent, smaller meals. Would also highly recommend GI consult to further assess esophageal function. Will f/u to to ensure diet tolerance/ review compensatory strategies.  SLP Visit Diagnosis Dysphagia, pharyngoesophageal phase (R13.14) Attention and concentration deficit following -- Frontal lobe and executive function deficit following -- Impact on safety and function Moderate aspiration risk   CHL IP TREATMENT RECOMMENDATION 03/28/2017 Treatment Recommendations Therapy as outlined in treatment plan below   Prognosis 03/28/2017 Prognosis for Safe Diet Advancement [place] Barriers to Reach Goals Other (Comment)  Barriers/Prognosis Comment -- CHL IP DIET RECOMMENDATION 03/28/2017 SLP Diet Recommendations Dysphagia 2 (Fine chop) solids;Thin liquid Liquid Administration via Cup;Straw Medication Administration Crushed with puree Compensations Slow rate;Small sips/bites;Follow solids with liquid Postural Changes Remain semi-upright after after feeds/meals (Comment);Seated upright at 90 degrees   CHL IP OTHER RECOMMENDATIONS 03/28/2017 Recommended Consults Consider esophageal assessment Oral Care Recommendations Oral care BID Other Recommendations Clarify dietary restrictions   CHL IP FOLLOW UP RECOMMENDATIONS 03/28/2017 Follow up Recommendations Skilled Nursing facility   [facility] IP FREQUENCY AND DURATION 03/28/2017 Speech Therapy Frequency (ACUTE ONLY) min 1 x/week Treatment Duration 1 week      CHL IP ORAL PHASE 03/28/2017 Oral Phase Impaired Oral - Pudding Teaspoon -- Oral - Pudding Cup -- Oral - Honey Teaspoon -- Oral - Honey Cup -- Oral - Nectar Teaspoon -- Oral - Nectar Cup -- Oral - Nectar Straw -- Oral - Thin Teaspoon -- Oral - Thin Cup WFL Oral - Thin Straw WFL Oral - Puree WFL Oral - Mech Soft -- Oral - Regular Impaired mastication Oral - Multi-Consistency -- Oral - Pill Reduced posterior propulsion Oral Phase - Comment --  CHL IP PHARYNGEAL PHASE 03/28/2017 Pharyngeal Phase Impaired Pharyngeal- Pudding Teaspoon -- Pharyngeal -- Pharyngeal- Pudding Cup -- Pharyngeal -- Pharyngeal- Honey Teaspoon -- Pharyngeal -- Pharyngeal- Honey Cup -- Pharyngeal -- Pharyngeal- Nectar Teaspoon -- Pharyngeal -- Pharyngeal- Nectar Cup -- Pharyngeal -- Pharyngeal- Nectar Straw -- Pharyngeal -- Pharyngeal- Thin Teaspoon -- Pharyngeal -- Pharyngeal- Thin Cup WFL Pharyngeal -- Pharyngeal- Thin Straw Delayed swallow initiation-pyriform sinuses;Penetration/Aspiration during  swallow Pharyngeal Material enters airway, remains ABOVE vocal cords then ejected out Pharyngeal- Puree WFL Pharyngeal -- Pharyngeal- Mechanical Soft -- Pharyngeal -- Pharyngeal- Regular  WFL Pharyngeal -- Pharyngeal- Multi-consistency -- Pharyngeal -- Pharyngeal- Pill WFL Pharyngeal -- Pharyngeal Comment --  CHL IP CERVICAL ESOPHAGEAL PHASE 03/28/2017 Cervical Esophageal Phase Impaired Pudding Teaspoon -- Pudding Cup -- Honey Teaspoon -- Honey Cup -- Nectar Teaspoon -- Nectar Cup -- Nectar Straw -- Thin Teaspoon -- Thin Cup WFL Thin Straw WFL Puree Esophageal backflow into cervical esophagus Mechanical Soft -- Regular Esophageal backflow into cervical esophagus Multi-consistency -- Pill Esophageal backflow into cervical esophagus Cervical Esophageal Comment -- No flowsheet data found. Information added by Genene Churn, study completed by Blair Heys, SLP PORTER,DABNEY 03/29/2017, 12:54 PM              [place] Ascites (abdomen Limited)  Result Date: 03/27/2017 CLINICAL DATA:  Ascites, history atrial fibrillation, CHF, COPD, hypertension EXAM: LIMITED ABDOMEN ULTRASOUND FOR ASCITES TECHNIQUE: Limited ultrasound survey for ascites was performed in all four abdominal quadrants. COMPARISON:  Abdomen ultrasound 03/24/2017, CT chest 03/26/2017 FINDINGS: No ascites is sonographically identified. IMPRESSION: No ascites identified. Electronically Signed   By: Lavonia Dana M.D.   On: 03/27/2017 09:34   US Abdomen Limited Ruq  Result Date: 03/24/2017 CLINICAL DATA:  77 year old male with elevated LFTs. Subsequent encounter. EXAM: ULTRASOUND ABDOMEN LIMITED RIGHT UPPER QUADRANT COMPARISON:  03/22/2017 renal sonogram. FINDINGS: Gallbladder: 1.3 cm mobile gallstone. At points gallbladder wall appears thickened measuring up to 5.6 mm. Per ultrasound technologist, patient was not tender over this region during scanning. Common bile duct: Diameter: 4.9 mm Liver: Heterogeneous with minimally lobulated contour raising possibility of cirrhosis with peri hepatic ascites. No dominant liver mass. Portal vein is patent on color Doppler imaging with normal direction of blood flow towards the liver. IMPRESSION: Heterogeneous  liver with minimally lobulated contour raising possibility of cirrhosis with peri hepatic ascites. No dominant liver mass. 1.3 cm mobile gallstone. At points gallbladder wall appears thickened measuring up to 5.6 mm. However, per ultrasound technologist, patient was not tender over this region during scanning. These results will be called to the ordering clinician or representative by the Radiologist Assistant, and communication documented in the PACS or zVision Dashboard. Electronically Signed   By: Genia Del M.D.   On: 03/24/2017 10:03    Orson Eva, DO  [facility] Pager [phone]  If 7PM-7AM, please contact night-coverage [email or web address] Password [email or web address] 03/31/2017, 6:58 PM   LOS: 11 days

## 2017-04-01 DIAGNOSIS — R1319 Other dysphagia: Secondary | ICD-10-CM

## 2017-04-01 LAB — COMPREHENSIVE METABOLIC PANEL
ALT: 116 U/L — ABNORMAL HIGH (ref 17–63)
ANION GAP: 13 (ref 5–15)
AST: 58 U/L — ABNORMAL HIGH (ref 15–41)
Albumin: 3 g/dL — ABNORMAL LOW (ref 3.5–5.0)
Alkaline Phosphatase: 65 U/L (ref 38–126)
BILIRUBIN TOTAL: 3.1 mg/dL — AB (ref 0.3–1.2)
BUN: 56 mg/dL — ABNORMAL HIGH (ref 6–20)
CHLORIDE: 88 mmol/L — AB (ref 101–111)
CO2: 34 mmol/L — ABNORMAL HIGH (ref 22–32)
Calcium: 9.3 mg/dL (ref 8.9–10.3)
Creatinine, Ser: 0.71 mg/dL (ref 0.61–1.24)
Glucose, Bld: 112 mg/dL — ABNORMAL HIGH (ref 65–99)
POTASSIUM: 3.5 mmol/L (ref 3.5–5.1)
Sodium: 135 mmol/L (ref 135–145)
TOTAL PROTEIN: 5.6 g/dL — AB (ref 6.5–8.1)

## 2017-04-01 MED ORDER — POTASSIUM CHLORIDE CRYS ER 20 MEQ PO TBCR
40.0000 meq | EXTENDED_RELEASE_TABLET | Freq: Once | ORAL | Status: AC
  Administered 2017-04-01: 40 meq via ORAL
  Filled 2017-04-01: qty 2

## 2017-04-01 MED ORDER — FUROSEMIDE 40 MG PO TABS
40.0000 mg | ORAL_TABLET | Freq: Two times a day (BID) | ORAL | Status: DC
  Administered 2017-04-02 (×2): 40 mg via ORAL
  Filled 2017-04-01 (×2): qty 1

## 2017-04-01 NOTE — Care Management Note (Signed)
Case Management Note  Patient Details  Name: Brett Carey MRN: [id number] Date of Birth: [birth year]  If discussed at Long Length of Stay Meetings, dates discussed:  04/01/17  Additional Comments:  Sherald Barge, RN 04/01/2017, 2:39 PM

## 2017-04-01 NOTE — Progress Notes (Signed)
PT has denied pain all shift. PT ate 2 cups of applesauce

## 2017-04-01 NOTE — Progress Notes (Signed)
Pt is in no distress on 3L HFNC. BIPAP is not needed at this time

## 2017-04-01 NOTE — Progress Notes (Signed)
Physical Therapy Treatment Patient Details Name: Brett Carey MRN: [id number] DOB: 1941-02-01 Today's Date: 04/01/2017    History of Present Illness Brett Carey is a 77 y.o. male with medical history significant of COPD, CHF, A. fib on Eliquis, hypertension comes in with several days of worsening shortness of breath and cough.  Patient takes care of his wife who has dementia at home and was very reluctant and refusing to come to the hospital according to his daughter because of his concerns with his wife being at home alone.  His daughter arranged for mom to be taking care of and finally convinced him to come to the ED.  Daughter reports that he has been very short of breath since yesterday.  He has been coughing a lot.  Patient denies any fevers or chills.  He has been wheezing at home.  He is a smoker and he does have oxygen as needed at home.  He has not had any nausea vomiting or diarrhea.  He has not been recently hospitalized or on recent antibiotics.  Patient found to have pneumonia in the ED and referred for admission for pneumonia.  Per emergency room physician report patient was stable for MedSurg floor and was in no respiratory distress earlier.  On arrival to [facility] floor however he is in moderate respiratory distress with very diminished breath sounds.  He actually says that he is not short of breath when obviously he is.    PT Comments    Patient demonstrates fair/poor return for attempting to prop up on elbows for supine to sit, requiring repeated verbal/tactile cueing, slightly increased distance/endurance for taking steps and tolerated sitting up in chair after therapy.  Patient will benefit from continued physical therapy in hospital and recommended venue below to increase strength, balance, endurance for safe ADLs and gait.     Follow Up Recommendations  SNF     Equipment Recommendations  None recommended by PT    Recommendations for Other Services       Precautions /  Restrictions Precautions Precautions: Fall Restrictions Weight Bearing Restrictions: No    Mobility  Bed Mobility Overal bed mobility: Needs Assistance Bed Mobility: Supine to Sit     Supine to sit: Mod assist     General bed mobility comments: very slow labored movement requiring Mod/max verbal/tactile cueing  Transfers Overall transfer level: Needs assistance Equipment used: Rolling walker (2 wheeled) Transfers: Sit to/from [facility] Sit to Stand: Mod assist;Min assist Stand pivot transfers: Min assist       General transfer comment: had difficulty with sit to stands due to BLE weakness  Ambulation/Gait Ambulation/Gait assistance: Min assist Ambulation Distance (Feet): 18 Feet Assistive device: Rolling walker (2 wheeled) Gait Pattern/deviations: Decreased step length - left;Decreased stance time - right;Decreased stride length   Gait velocity interpretation: Below normal speed for age/gender General Gait Details: demonstrates slow labored movement, wide base of support, slightly improvement for staying closer to RW, limited secondary to c/o fatigue/SOB with O2 sats at 94% while on 3 LPM   Stairs            Wheelchair Mobility    Modified Rankin (Stroke Patients Only)       Balance Overall balance assessment: Needs assistance Sitting-balance support: No upper extremity supported;Feet supported Sitting balance-Leahy Scale: Good     Standing balance support: Bilateral upper extremity supported;During functional activity Standing balance-Leahy Scale: Fair Standing balance comment: Fair using rolling walker  Cognition Arousal/Alertness: Awake/alert Behavior During Therapy: WFL for tasks assessed/performed Overall Cognitive Status: Within Functional Limits for tasks assessed                                        Exercises General Exercises - Lower Extremity Ankle Circles/Pumps:  Seated;AROM;Strengthening;Both;10 reps Long Arc Quad: Seated;AAROM;Strengthening;Both;10 reps Hip Flexion/Marching: Seated;AAROM;Strengthening;10 reps    General Comments        Pertinent Vitals/Pain Pain Assessment: No/denies pain    Home Living                      Prior Function            PT Goals (current goals can now be found in the care plan section) Acute Rehab PT Goals Patient Stated Goal: return home PT Goal Formulation: With patient Time For Goal Achievement: 04/07/17 Potential to Achieve Goals: Fair    Frequency    Min 3X/week      PT Plan Current plan remains appropriate    Co-evaluation              AM-PAC PT "6 Clicks" Daily Activity  Outcome Measure  Difficulty turning over in bed (including adjusting bedclothes, sheets and blankets)?: A Little Difficulty moving from lying on back to sitting on the side of the bed? : A Lot Difficulty sitting down on and standing up from a chair with arms (e.g., wheelchair, bedside commode, etc,.)?: A Little Help needed moving to and from a bed to chair (including a wheelchair)?: A Little Help needed walking in hospital room?: A Lot Help needed climbing 3-5 steps with a railing? : A Lot 6 Click Score: 15    End of Session Equipment Utilized During Treatment: Gait belt;Oxygen Activity Tolerance: Patient tolerated treatment well;Patient limited by fatigue Patient left: in chair;with call bell/phone within reach;with chair alarm set;with family/visitor present Nurse Communication: Mobility status;Other (comment)(RN aware patient up in chair) PT Visit Diagnosis: Unsteadiness on feet (R26.81);Other abnormalities of gait and mobility (R26.89);Muscle weakness (generalized) (M62.81)     Time: 7412-8786 PT Time Calculation (min) (ACUTE ONLY): 33 min  Charges:  $Therapeutic Exercise: 8-22 mins $Therapeutic Activity: 8-22 mins                    G Codes:       3:22 PM, 04-15-2017 Lonell Grandchild,  MPT Physical Therapist with [facility] [phone] office [phone] mobile phone

## 2017-04-01 NOTE — Progress Notes (Signed)
PROGRESS NOTE  Brett Carey NFA:[id number] DOB: 02-06-1941 DOA: 03/20/2017 PCP: Celene Squibb, MD   Brief History: 77 year old male with a history of essential hypertension, COPD, atrial fibrillation presented with shortness of breath, dyspnea on exertion, and confusion. At the time of admission, the patient was in rate controlled atrial fibrillation. He was started on azithromycin and ceftriaxone. Initial chest x-ray showed vascular congestion with moderate pleural effusion. The patient was given furosemide 80 mg IV x1. The patient was noted to be hypoxic requiring 3 L of oxygen.  The patient finished 7 days of antibiotics during the hospitalization.  He continued to remain fluid overloaded.  An attempt was made for paracentesis, but there was no safe window to perform.  There was also an attempt for thoracocentesis of the patient's right loculated pleural effusion, but this was unsuccessful due to its location and complex fluid.  The patient was started on intravenous furosemide with good clinical improvement.  Assessment/Plan: Acute respiratory failure with hypoxia -Secondary to pneumonia, CHFand COPD exacerbation -CT chest to clarify pleural effusions vs atelec vs consolidation -03/26/17 CT chest--moderate emphysema. Moderate partially loculated right pleural effusion. Consolidation-RLLsmall perihepatic ascites;  -stable on 3L  Community acquired pneumonia/Lobar pneumonia -Initially started on ceftriaxone and azithromycin -changed to amox/clav on 1/29 -d/c abx after 03/27/17 doses--finished 7 days abx  COPD exacerbation -weaned off steroids during the hospitalization -stable on3L -50 pk years tobacco  Acute onChronic diastolic CHF -check ION--629 -Echo--EF 65-70%, no WMA, PASP 38, mild to moderate TR -NEG 9.9 L; I/O 2/4 and 2/5 incomplete -NEG 15 lbs for the hospitalization -increasedIV furosemideto tid>>>transition to po lasix 04/02/17 -fluid  restrict  Chronic Atrial Fibrillation -continue apixaban -add metoprolol 12.5 mg bid -increasediltiazem60 mg po q 6 hrs -now rate controlled  Pleural Effusion -03/26/17 CT chest--moderate emphysema. Moderate partially loculated right pleural effusion. Consolidation-RLLsmall perihepatic ascites;  -May be parapneumonic versus CHF -attempted thora--complex fluid in chest, insufficient to perform  Dysphagia -MBS--reveals mostly esophageal issue -dysphagia 2 with thin liquids -not a candidate for EGD presently due to respiratory status -now swallowing without difficulty  AKI -due to infectious proceess -baseline creatinine 0.8-1.0 -serum creatinine peaked 1.86 -improved  Transaminasemia -due to hypotension -improving -1/28 RUQ US--heterogenous liver concerning for cirrhosis with perihepatic ascites;cholelithiasis with gallbladder wall thickening; no ductal dilatation -no abdominal pain  Liver Cirrhosiswith ascites -noted on abdominal imaging -likely due Etoh -hep B surface antigen--neg -hep c antibody--neg -attemptedparacentesis-->not enough fluid  Moderate malnutrition -continue supplements  Hypomagnesemia -repleted   Disposition Plan:SNF2/6/19 if stable Family Communication:Daughter updatedat bedside2/4   Consultants:none  Code Status: FULL   DVT Prophylaxis:apixaban   Procedures: As Listed in Progress Note Above  Antibiotics: Ceftriaxone 1/24>>>1/28 azithro 1/24>>>1/28 Amox/clav 1/29>>>2/1     Subjective: Patient denies fevers, chills, headache, chest pain, dyspnea, nausea, vomiting, diarrhea, abdominal pain, dysuria, hematuria, hematochezia, and melena.   Objective: Vitals:   04/01/17 0948 04/01/17 0950 04/01/17 1500 04/01/17 1638  BP:    105/75  Pulse:    61  Resp:    18  Temp:    (!) 97.5 F (36.4 C)  TempSrc:      SpO2: 93% 96% 92% 99%  Weight:      Height:        Intake/Output Summary  (Last 24 hours) at 04/01/2017 1653 Last data filed at 04/01/2017 1300 Gross per 24 hour  Intake 483 ml  Output -  Net 483 ml   Weight change: -4.4  kg (-11.2 oz) Exam:   General:  Pt is alert, follows commands appropriately, not in acute distress  HEENT: No icterus, No thrush, No neck mass, [place]/AT  Cardiovascular: RRR, S1/S2, no rubs, no gallops  Respiratory: Bibasilar crackles, right greater than left.  Diminished breath sounds on the right.  No wheezing.  Abdomen: Soft/+BS, non tender, non distended, no guarding  Extremities: 1+LE edema, No lymphangitis, No petechiae, No rashes, no synovitis   Data Reviewed: I have personally reviewed following labs and imaging studies Basic Metabolic Panel: Recent Labs  Lab 03/28/17 0544 03/29/17 0732 03/30/17 0646 03/31/17 0547 04/01/17 0532  NA 137 138 137 135 135  K 4.6 4.1 4.1 3.7 3.5  CL 92* 92* 89* 88* 88*  CO2 33* 35* 35* 35* 34*  GLUCOSE 86 122* 100* 92 112*  BUN 40* 35* 36* 41* 56*  CREATININE 0.69 0.66 0.61 0.64 0.71  CALCIUM 9.4 9.1 9.2 9.2 9.3  MG 1.6* 1.6* 2.0  --   --    Liver Function Tests: Recent Labs  Lab 03/26/17 0455 03/27/17 0459 03/29/17 0732 04/01/17 0532  AST 108* 80* 48* 58*  ALT 235* 199* 127* 116*  ALKPHOS 67 71 63 65  BILITOT 2.7* 2.9* 2.9* 3.1*  PROT 5.8* 6.0* 5.7* 5.6*  ALBUMIN 3.1* 3.2* 3.2* 3.0*   No results for input(s): LIPASE, AMYLASE in the last 168 hours. No results for input(s): AMMONIA in the last 168 hours. Coagulation Profile: No results for input(s): INR, PROTIME in the last 168 hours. CBC: Recent Labs  Lab 03/26/17 0455 03/27/17 0459 03/28/17 0544  WBC 13.8* 13.5* 14.7*  HGB 16.0 16.3 16.5  HCT 50.6 51.4 51.4  MCV 97.3 97.5 97.7  PLT 86* 80* 71*   Cardiac Enzymes: No results for input(s): CKTOTAL, CKMB, CKMBINDEX, TROPONINI in the last 168 hours. BNP: Invalid input(s): POCBNP CBG: No results for input(s): GLUCAP in the last 168 hours. HbA1C: No results for  input(s): HGBA1C in the last 72 hours. Urine analysis:    Component Value Date/Time   COLORURINE AMBER (A) 03/20/2017 1900   APPEARANCEUR HAZY (A) 03/20/2017 1900   LABSPEC 1.025 03/20/2017 1900   PHURINE 5.0 03/20/2017 1900   GLUCOSEU NEGATIVE 03/20/2017 1900   HGBUR NEGATIVE 03/20/2017 1900   BILIRUBINUR SMALL (A) 03/20/2017 1900   KETONESUR NEGATIVE 03/20/2017 1900   PROTEINUR 100 (A) 03/20/2017 1900   NITRITE NEGATIVE 03/20/2017 1900   LEUKOCYTESUR NEGATIVE 03/20/2017 1900   Sepsis Labs: @LABRCNTIP (procalcitonin:4,lacticidven:4) )No results found for this or any previous visit (from the past 240 hour(s)).   Scheduled Meds: . apixaban  5 mg Oral BID  . budesonide (PULMICORT) nebulizer solution  0.5 mg Nebulization BID  . diltiazem  240 mg Oral Daily  . feeding supplement (ENSURE ENLIVE)  237 mL Oral BID BM  . furosemide  40 mg Intravenous Q8H  . [START ON 04/02/2017] furosemide  40 mg Oral BID  . guaiFENesin  1,200 mg Oral BID  . HYDROcodone-acetaminophen  1 tablet Oral Q8H  . ipratropium-albuterol  3 mL Nebulization TID  . mouth rinse  15 mL Mouth Rinse BID  . metoprolol tartrate  12.5 mg Oral BID  . polyethylene glycol  17 g Oral Daily  . potassium chloride  40 mEq Oral Once  . sodium chloride flush  3 mL Intravenous Q12H   Continuous Infusions: . sodium chloride      Procedures/Studies: Dg Chest 2 View  Result Date: 03/20/2017 CLINICAL DATA:  Generalize weakness with decreased appetite and  short of breath EXAM: CHEST  2 VIEW COMPARISON:  01/04/2014 FINDINGS: Suspected moderate right pleural effusion. Cardiomegaly with vascular congestion and mild pulmonary edema. Aortic atherosclerosis. No pneumothorax. Mild degenerative changes of the spine. Airspace disease at the right middle lobe and lung base. IMPRESSION: 1. Cardiomegaly with vascular congestion and mild pulmonary edema. Suspected moderate right pleural effusion 2. Airspace disease at the right middle lobe and lung  base could reflect superimposed pneumonia Electronically Signed   By: Donavan Foil M.D.   On: 03/20/2017 19:07   Ct Chest Wo Contrast  Result Date: 03/26/2017 CLINICAL DATA:  COPD and hypoxia.  Evaluate effusions. EXAM: CT CHEST WITHOUT CONTRAST TECHNIQUE: Multidetector CT imaging of the chest was performed following the standard protocol without IV contrast. COMPARISON:  Chest radiograph from 03/20/2017 FINDINGS: Cardiovascular: There is moderate to marked cardiac enlargement. No pericardial effusion. Aortic atherosclerosis. Calcifications are noted within the LAD Coronary artery. Mediastinum/Nodes: The trachea is patent and midline. Normal appearance of the esophagus. No mediastinal or hilar adenopathy. No axillary or supraclavicular adenopathy. Lungs/Pleura: Moderate changes of emphysema. Small left pleural effusion with overlying subpleural atelectasis. There is a moderate volume partially loculated right pleural effusion. There is compressive type atelectasis and consolidation within the right lower lobe. Complete atelectasis/consolidation of the right middle lobe identified. Upper Abdomen: There is a small volume of perihepatic ascites. Low-attenuation left adrenal nodule measures 2 cm and likely represents a benign adenoma. Musculoskeletal: There is degenerative disc disease noted within the thoracic spine. No aggressive lytic or sclerotic bone lesions. IMPRESSION: 1. Moderate partially loculated right pleural effusion with complete atelectasis/consolidation of the right middle lobe. Followup imaging is advised to ensure re-expansion of the right middle lobe. If this does not improve then consider further evaluation with either bronchoscopy or PET-CT to assess for underlying lung lesion. 2. Small left pleural effusion. 3. Marked cardiac enlargement, aortic atherosclerosis and LAD coronary artery calcifications. Aortic Atherosclerosis (ICD10-I70.0). 4. Small volume of ascites within the right upper  quadrant of the abdomen. 5. Diffuse bronchial wall thickening with emphysema, as above; imaging findings suggestive of underlying COPD. Emphysema (ICD10-J43.9). Electronically Signed   By: Kerby Moors M.D.   On: 03/26/2017 19:28   [place] Chest (pleural Effusion)  Result Date: 03/27/2017 CLINICAL DATA:  RIGHT pleural effusion, for thoracentesis EXAM: CHEST ULTRASOUND COMPARISON:  Chest CT 03/26/2017 FINDINGS: Sonography of the RIGHT hemithorax was performed. A thin rim of complicated fluid is seen surrounding the RIGHT lung base, containing scattered internal echoes. Volume of fluid visualized is insufficient for thoracentesis. IMPRESSION: Complex appearing fluid at the inferior RIGHT hemithorax, insufficient volume for thoracentesis. Electronically Signed   By: Lavonia Dana M.D.   On: 03/27/2017 12:51   US Renal  Result Date: 03/22/2017 CLINICAL DATA:  Hypertension.  Acute kidney injury. EXAM: RENAL / URINARY TRACT ULTRASOUND COMPLETE COMPARISON:  None. FINDINGS: Right Kidney: Length: 10.9 cm. Mild cortical thinning. No hydronephrosis. 2 cm cyst in the lower pole. Left Kidney: Length: 11.0 cm. Cortical thickness appears minimally thin end. Multiple cysts, the largest measuring 1.8 cm in the midportion. Bladder: Foley catheter in place. Some ascites is noted.  Probable cirrhosis of the liver. IMPRESSION: Kidneys are normal in size but show mild cortical thinning, right more than left. No hydronephrosis or significant focal lesion. Benign appearing simple cysts. Foley catheter in the bladder. Probable cirrhosis and ascites. Electronically Signed   By: Nelson Chimes M.D.   On: 03/22/2017 14:26   Dg Swallowing Func-speech Pathology  Result Date: 03/29/2017  Objective Swallowing Evaluation: Type of Study: MBS-Modified Barium Swallow Study  Patient Details Name: Brett Carey MRN: [id number] Date of Birth: 22-Feb-1941 Today's Date: 03/29/2017 Time: SLP Start Time (ACUTE ONLY): 3903 -SLP Stop Time (ACUTE ONLY): 0092 SLP  Time Calculation (min) (ACUTE ONLY): 32 min Past Medical History: Past Medical History: Diagnosis Date . Atrial fibrillation with RVR ([place]) 01/04/2014 . CHF (congestive heart failure) ([place])  . COPD (chronic obstructive pulmonary disease) ([place])  . Gout  . History of hiatal hernia  . Hypertension  . Pneumonia X 2 Past Surgical History: Past Surgical History: Procedure Laterality Date . BACK SURGERY   . CATARACT EXTRACTION W/ INTRAOCULAR LENS  IMPLANT, BILATERAL Bilateral 2000's . LUMBAR DISC SURGERY   HPI: 77 year old male with a history of COPD, atrial fibrillation, presented to the hospital with progressive shortness of breath.  Found to have COPD exacerbation as well as community-acquired pneumonia.  He had respiratory failure requiring BiPAP therapy, now on nasal cannula.  He has been treated with steroids, nebs and antibiotics. Hospital course complicated by development of AKI due to hypotension, which has since improved. He is slowly improving. Swallow evaluation pending for dysphagia. Will need SNF placement on discharge. Anticipate discharge in the next 1-2 days.  Subjective: "He says food won't go down." Assessment / Plan / Recommendation CHL IP CLINICAL IMPRESSIONS 03/28/2017 Clinical Impression Pt currently with what is suspected to be a primary esophageal dysphagia. Pt did have a delay in swallow initiation and flash penetration x1 of thin liquids which was not considered abnormal. Of concern was esophageal sweep when pt swallowed barium tablet and esophagus was nearly full of barium material; pill eventually cleared to stomach with numerous follow-up boluses of puree to push it through. Pt is at an increased risk of aspiration due to esophageal function. Recommend dysphagia 2/ thin liquids, meds crushed in puree, supervision to assist with feeding, with strict esophageal precautions- sitting upright 90 degrees, small bites/ sips, alternate food/ liquid, have pt sit upright at least an hour after meal before  reclining. Pt also may benefit from more frequent, smaller meals. Would also highly recommend GI consult to further assess esophageal function. Will f/u to to ensure diet tolerance/ review compensatory strategies.  SLP Visit Diagnosis Dysphagia, pharyngoesophageal phase (R13.14) Attention and concentration deficit following -- Frontal lobe and executive function deficit following -- Impact on safety and function Moderate aspiration risk   CHL IP TREATMENT RECOMMENDATION 03/28/2017 Treatment Recommendations Therapy as outlined in treatment plan below   Prognosis 03/28/2017 Prognosis for Safe Diet Advancement Fair Barriers to Reach Goals Other (Comment) Barriers/Prognosis Comment -- CHL IP DIET RECOMMENDATION 03/28/2017 SLP Diet Recommendations Dysphagia 2 (Fine chop) solids;Thin liquid Liquid Administration via Cup;Straw Medication Administration Crushed with puree Compensations Slow rate;Small sips/bites;Follow solids with liquid Postural Changes Remain semi-upright after after feeds/meals (Comment);Seated upright at 90 degrees   CHL IP OTHER RECOMMENDATIONS 03/28/2017 Recommended Consults Consider esophageal assessment Oral Care Recommendations Oral care BID Other Recommendations Clarify dietary restrictions   CHL IP FOLLOW UP RECOMMENDATIONS 03/28/2017 Follow up Recommendations Skilled Nursing facility   [facility] IP FREQUENCY AND DURATION 03/28/2017 Speech Therapy Frequency (ACUTE ONLY) min 1 x/week Treatment Duration 1 week      CHL IP ORAL PHASE 03/28/2017 Oral Phase Impaired Oral - Pudding Teaspoon -- Oral - Pudding Cup -- Oral - Honey Teaspoon -- Oral - Honey Cup -- Oral - Nectar Teaspoon -- Oral - Nectar Cup -- Oral - Nectar Straw -- Oral - Thin Teaspoon -- Oral - Thin  Cup [facility] Oral - Thin Straw WFL Oral - Puree WFL Oral - Mech Soft -- Oral - Regular Impaired mastication Oral - Multi-Consistency -- Oral - Pill Reduced posterior propulsion Oral Phase - Comment --  CHL IP PHARYNGEAL PHASE 03/28/2017 Pharyngeal Phase Impaired  Pharyngeal- Pudding Teaspoon -- Pharyngeal -- Pharyngeal- Pudding Cup -- Pharyngeal -- Pharyngeal- Honey Teaspoon -- Pharyngeal -- Pharyngeal- Honey Cup -- Pharyngeal -- Pharyngeal- Nectar Teaspoon -- Pharyngeal -- Pharyngeal- Nectar Cup -- Pharyngeal -- Pharyngeal- Nectar Straw -- Pharyngeal -- Pharyngeal- Thin Teaspoon -- Pharyngeal -- Pharyngeal- Thin Cup WFL Pharyngeal -- Pharyngeal- Thin Straw Delayed swallow initiation-pyriform sinuses;Penetration/Aspiration during swallow Pharyngeal Material enters airway, remains ABOVE vocal cords then ejected out Pharyngeal- Puree WFL Pharyngeal -- Pharyngeal- Mechanical Soft -- Pharyngeal -- Pharyngeal- Regular WFL Pharyngeal -- Pharyngeal- Multi-consistency -- Pharyngeal -- Pharyngeal- Pill WFL Pharyngeal -- Pharyngeal Comment --  CHL IP CERVICAL ESOPHAGEAL PHASE 03/28/2017 Cervical Esophageal Phase Impaired Pudding Teaspoon -- Pudding Cup -- Honey Teaspoon -- Honey Cup -- Nectar Teaspoon -- Nectar Cup -- Nectar Straw -- Thin Teaspoon -- Thin Cup WFL Thin Straw WFL Puree Esophageal backflow into cervical esophagus Mechanical Soft -- Regular Esophageal backflow into cervical esophagus Multi-consistency -- Pill Esophageal backflow into cervical esophagus Cervical Esophageal Comment -- No flowsheet data found. Information added by Genene Churn, study completed by Blair Heys, SLP PORTER,DABNEY 03/29/2017, 12:54 PM              [place] Ascites (abdomen Limited)  Result Date: 03/27/2017 CLINICAL DATA:  Ascites, history atrial fibrillation, CHF, COPD, hypertension EXAM: LIMITED ABDOMEN ULTRASOUND FOR ASCITES TECHNIQUE: Limited ultrasound survey for ascites was performed in all four abdominal quadrants. COMPARISON:  Abdomen ultrasound 03/24/2017, CT chest 03/26/2017 FINDINGS: No ascites is sonographically identified. IMPRESSION: No ascites identified. Electronically Signed   By: Lavonia Dana M.D.   On: 03/27/2017 09:34   US Abdomen Limited Ruq  Result Date: 03/24/2017 CLINICAL  DATA:  77 year old male with elevated LFTs. Subsequent encounter. EXAM: ULTRASOUND ABDOMEN LIMITED RIGHT UPPER QUADRANT COMPARISON:  03/22/2017 renal sonogram. FINDINGS: Gallbladder: 1.3 cm mobile gallstone. At points gallbladder wall appears thickened measuring up to 5.6 mm. Per ultrasound technologist, patient was not tender over this region during scanning. Common bile duct: Diameter: 4.9 mm Liver: Heterogeneous with minimally lobulated contour raising possibility of cirrhosis with peri hepatic ascites. No dominant liver mass. Portal vein is patent on color Doppler imaging with normal direction of blood flow towards the liver. IMPRESSION: Heterogeneous liver with minimally lobulated contour raising possibility of cirrhosis with peri hepatic ascites. No dominant liver mass. 1.3 cm mobile gallstone. At points gallbladder wall appears thickened measuring up to 5.6 mm. However, per ultrasound technologist, patient was not tender over this region during scanning. These results will be called to the ordering clinician or representative by the Radiologist Assistant, and communication documented in the PACS or zVision Dashboard. Electronically Signed   By: Genia Del M.D.   On: 03/24/2017 10:03    Orson Eva, DO  [facility] Pager [phone]  If 7PM-7AM, please contact night-coverage [email or web address] Password [email or web address] 04/01/2017, 4:53 PM   LOS: 12 days

## 2017-04-02 ENCOUNTER — Encounter
Admission: RE | Admit: 2017-04-02 | Discharge: 2017-04-02 | Disposition: A | Discharge status: Home / Self Care | Payer: Medicare HMO | Source: Ambulatory Visit | Attending: Internal Medicine | Admitting: Internal Medicine

## 2017-04-02 DIAGNOSIS — R262 Difficulty in walking, not elsewhere classified: Secondary | ICD-10-CM | POA: Diagnosis not present

## 2017-04-02 DIAGNOSIS — R279 Unspecified lack of coordination: Secondary | ICD-10-CM | POA: Diagnosis not present

## 2017-04-02 DIAGNOSIS — R1312 Dysphagia, oropharyngeal phase: Secondary | ICD-10-CM | POA: Diagnosis not present

## 2017-04-02 DIAGNOSIS — J441 Chronic obstructive pulmonary disease with (acute) exacerbation: Secondary | ICD-10-CM | POA: Diagnosis not present

## 2017-04-02 DIAGNOSIS — R410 Disorientation, unspecified: Secondary | ICD-10-CM | POA: Insufficient documentation

## 2017-04-02 DIAGNOSIS — N179 Acute kidney failure, unspecified: Secondary | ICD-10-CM | POA: Diagnosis not present

## 2017-04-02 DIAGNOSIS — I5033 Acute on chronic diastolic (congestive) heart failure: Secondary | ICD-10-CM

## 2017-04-02 DIAGNOSIS — M6281 Muscle weakness (generalized): Secondary | ICD-10-CM | POA: Diagnosis not present

## 2017-04-02 DIAGNOSIS — R69 Illness, unspecified: Secondary | ICD-10-CM | POA: Diagnosis not present

## 2017-04-02 DIAGNOSIS — K7031 Alcoholic cirrhosis of liver with ascites: Secondary | ICD-10-CM | POA: Diagnosis present

## 2017-04-02 DIAGNOSIS — Z7401 Bed confinement status: Secondary | ICD-10-CM | POA: Diagnosis not present

## 2017-04-02 DIAGNOSIS — Z8701 Personal history of pneumonia (recurrent): Secondary | ICD-10-CM | POA: Diagnosis not present

## 2017-04-02 DIAGNOSIS — I110 Hypertensive heart disease with heart failure: Secondary | ICD-10-CM | POA: Diagnosis not present

## 2017-04-02 DIAGNOSIS — I482 Chronic atrial fibrillation: Secondary | ICD-10-CM | POA: Diagnosis not present

## 2017-04-02 DIAGNOSIS — J449 Chronic obstructive pulmonary disease, unspecified: Secondary | ICD-10-CM | POA: Diagnosis not present

## 2017-04-02 DIAGNOSIS — M1A00X0 Idiopathic chronic gout, unspecified site, without tophus (tophi): Secondary | ICD-10-CM | POA: Diagnosis not present

## 2017-04-02 DIAGNOSIS — J159 Unspecified bacterial pneumonia: Secondary | ICD-10-CM | POA: Diagnosis not present

## 2017-04-02 DIAGNOSIS — J9601 Acute respiratory failure with hypoxia: Secondary | ICD-10-CM

## 2017-04-02 DIAGNOSIS — I5043 Acute on chronic combined systolic (congestive) and diastolic (congestive) heart failure: Secondary | ICD-10-CM | POA: Diagnosis not present

## 2017-04-02 DIAGNOSIS — J189 Pneumonia, unspecified organism: Secondary | ICD-10-CM | POA: Diagnosis not present

## 2017-04-02 DIAGNOSIS — J9611 Chronic respiratory failure with hypoxia: Secondary | ICD-10-CM | POA: Diagnosis not present

## 2017-04-02 LAB — BASIC METABOLIC PANEL
ANION GAP: 13 (ref 5–15)
BUN: 81 mg/dL — ABNORMAL HIGH (ref 6–20)
CALCIUM: 9.8 mg/dL (ref 8.9–10.3)
CO2: 33 mmol/L — ABNORMAL HIGH (ref 22–32)
Chloride: 91 mmol/L — ABNORMAL LOW (ref 101–111)
Creatinine, Ser: 0.87 mg/dL (ref 0.61–1.24)
GFR calc Af Amer: >60 mL/min (ref >60)
GLUCOSE: 139 mg/dL — AB (ref 65–99)
Potassium: 4.4 mmol/L (ref 3.5–5.1)
SODIUM: 137 mmol/L (ref 135–145)

## 2017-04-02 MED ORDER — FUROSEMIDE 40 MG PO TABS: 40.0000 mg | ORAL_TABLET | Freq: Two times a day (BID) | ORAL | Status: AC

## 2017-04-02 MED ORDER — METOPROLOL TARTRATE 25 MG PO TABS: 12.5000 mg | ORAL_TABLET | Freq: Two times a day (BID) | ORAL | Status: AC

## 2017-04-02 MED ORDER — DILTIAZEM HCL ER COATED BEADS 240 MG PO CP24: 240.0000 mg | ORAL_CAPSULE | Freq: Every day | ORAL | Status: AC

## 2017-04-02 NOTE — Discharge Summary (Signed)
Physician Discharge Summary  Brett Carey ZDG:[id number] DOB: Aug 01, 1940 DOA: 03/20/2017  PCP: Celene Squibb, MD  Admit date: 03/20/2017 Discharge date: 04/02/2017  Time spent: 45 minutes  Recommendations for Outpatient Follow-up:  -Will be discharged back to SNF today.  Discharge Diagnoses:  Principal Problem:   PNA (pneumonia) Active Problems:   Atrial fibrillation (HCC)   Benign essential HTN   Acute respiratory failure with hypoxia (HCC)   Community acquired pneumonia of right middle lobe of lung (HCC)   COPD (chronic obstructive pulmonary disease) (HCC)   CHF (congestive heart failure) (HCC)   Malnutrition of moderate degree   AKI (acute kidney injury) (HCC)   Elevated LFTs   Lobar pneumonia (HCC)   COPD with acute exacerbation (HCC)   Alcoholic cirrhosis of liver with ascites (HCC)   Pleural effusion on right   Acute on chronic diastolic CHF (congestive heart failure) ([place])   Dysphagia   Discharge Condition: Stable and improved  Filed Weights   03/31/17 0431 04/01/17 0600 04/02/17 0620  Weight: 94.7 kg (208 lb 12.4 oz) 90.3 kg (199 lb 1.2 oz) 85.6 kg (188 lb 11.4 oz)    History of present illness:  As per Dr. Shanon Brow on 1/24: Brett Carey is a 77 y.o. male with medical history significant of COPD, CHF, A. fib on Eliquis, hypertension comes in with several days of worsening shortness of breath and cough.  Patient takes care of his wife who has dementia at home and was very reluctant and refusing to come to the hospital according to his daughter because of his concerns with his wife being at home alone.  His daughter arranged for mom to be taking care of and finally convinced him to come to the ED.  Daughter reports that he has been very short of breath since yesterday.  He has been coughing a lot.  Patient denies any fevers or chills.  He has been wheezing at home.  He is a smoker and he does have oxygen as needed at home.  He has not had any nausea vomiting or  diarrhea.  He has not been recently hospitalized or on recent antibiotics.  Patient found to have pneumonia in the ED and referred for admission for pneumonia.  Per emergency room physician report patient was stable for MedSurg floor and was in no respiratory distress earlier.  On arrival to [place] floor however he is in moderate respiratory distress with very diminished breath sounds.  He actually says that he is not short of breath when obviously he is.    Hospital Course:   Acute hypoxemic respiratory failure -Multifactorial secondary to pneumonia, CHF and COPD exacerbation. -He is currently stable on 3 L of oxygen.  Community-acquired pneumonia/lobar pneumonia  -initially started on Rocephin and azithromycin, subsequently switched to Unasyn on 1/29. -Completed antibiotic course on 1/31.,  No further need of antibiotics upon discharge.  COPD with acute exacerbation -Weaned off steroids, stable on 3 L, currently stable without wheezing.  Acute on chronic diastolic heart failure -Echo this admission with ejection fraction of 65-70%, no wall motion abnormalities with mild to moderate tricuspid regurgitation. -He is 10 L negative since admission. -Has been transitioned to oral Lasix.  Chronic atrial fibrillation -Rate controlled on metoprolol and diltiazem. -Anticoagulated on apixaban.  Acute renal failure -Creatinine peaked at 1.86. -Resolved, creatinine down to 0.87 on discharge. -Likely due to acute tubular necrosis/prerenal azotemia due to acute infectious processes.  Procedures:  None   Consultations:  None  Discharge  Instructions  Discharge Instructions    Diet - low sodium heart healthy   Complete by:  As directed    Increase activity slowly   Complete by:  As directed      Allergies as of 04/02/2017   No Known Allergies     Medication List    STOP taking these medications   metoprolol succinate 50 MG 24 hr tablet Commonly known as:  TOPROL-XL     TAKE  these medications   apixaban 5 MG Tabs tablet Commonly known as:  ELIQUIS Take 1 tablet (5 mg total) by mouth 2 (two) times daily.   colchicine 0.6 MG tablet Commonly known as:  COLCRYS Take 1 tablet (0.6 mg total) by mouth 2 (two) times daily.   diltiazem 240 MG 24 hr capsule Commonly known as:  CARDIZEM CD Take 1 capsule (240 mg total) by mouth daily. Start taking on:  04/03/2017   furosemide 40 MG tablet Commonly known as:  LASIX Take 1 tablet (40 mg total) by mouth 2 (two) times daily.   HYDROcodone-acetaminophen 10-325 MG tablet Commonly known as:  NORCO Take 1 tablet by mouth every 6 (six) hours.   LORazepam 0.5 MG tablet Commonly known as:  ATIVAN Take 0.5 mg by mouth at bedtime.   metoprolol tartrate 25 MG tablet Commonly known as:  LOPRESSOR Take 0.5 tablets (12.5 mg total) by mouth 2 (two) times daily.   PROAIR HFA 108 (90 Base) MCG/ACT inhaler Generic drug:  albuterol Inhale 2 puffs into the lungs every 6 (six) hours as needed for wheezing or shortness of breath.      No Known Allergies Contact information for after-discharge care    Destination    [facility] Follow up.   Service:  Skilled Nursing Contact information: [address] [place] [phone]               The results of significant diagnostics from this hospitalization (including imaging, microbiology, ancillary and laboratory) are listed below for reference.    Significant Diagnostic Studies: Dg Chest 2 View  Result Date: 03/20/2017 CLINICAL DATA:  Generalize weakness with decreased appetite and short of breath EXAM: CHEST  2 VIEW COMPARISON:  01/04/2014 FINDINGS: Suspected moderate right pleural effusion. Cardiomegaly with vascular congestion and mild pulmonary edema. Aortic atherosclerosis. No pneumothorax. Mild degenerative changes of the spine. Airspace disease at the right middle lobe and lung base. IMPRESSION: 1. Cardiomegaly with  vascular congestion and mild pulmonary edema. Suspected moderate right pleural effusion 2. Airspace disease at the right middle lobe and lung base could reflect superimposed pneumonia Electronically Signed   By: Donavan Foil M.D.   On: 03/20/2017 19:07   Ct Chest Wo Contrast  Result Date: 03/26/2017 CLINICAL DATA:  COPD and hypoxia.  Evaluate effusions. EXAM: CT CHEST WITHOUT CONTRAST TECHNIQUE: Multidetector CT imaging of the chest was performed following the standard protocol without IV contrast. COMPARISON:  Chest radiograph from 03/20/2017 FINDINGS: Cardiovascular: There is moderate to marked cardiac enlargement. No pericardial effusion. Aortic atherosclerosis. Calcifications are noted within the LAD Coronary artery. Mediastinum/Nodes: The trachea is patent and midline. Normal appearance of the esophagus. No mediastinal or hilar adenopathy. No axillary or supraclavicular adenopathy. Lungs/Pleura: Moderate changes of emphysema. Small left pleural effusion with overlying subpleural atelectasis. There is a moderate volume partially loculated right pleural effusion. There is compressive type atelectasis and consolidation within the right lower lobe. Complete atelectasis/consolidation of the right middle lobe identified. Upper Abdomen: There is a small volume  of perihepatic ascites. Low-attenuation left adrenal nodule measures 2 cm and likely represents a benign adenoma. Musculoskeletal: There is degenerative disc disease noted within the thoracic spine. No aggressive lytic or sclerotic bone lesions. IMPRESSION: 1. Moderate partially loculated right pleural effusion with complete atelectasis/consolidation of the right middle lobe. Followup imaging is advised to ensure re-expansion of the right middle lobe. If this does not improve then consider further evaluation with either bronchoscopy or PET-CT to assess for underlying lung lesion. 2. Small left pleural effusion. 3. Marked cardiac enlargement, aortic  atherosclerosis and LAD coronary artery calcifications. Aortic Atherosclerosis (ICD10-I70.0). 4. Small volume of ascites within the right upper quadrant of the abdomen. 5. Diffuse bronchial wall thickening with emphysema, as above; imaging findings suggestive of underlying COPD. Emphysema (ICD10-J43.9). Electronically Signed   By: Kerby Moors M.D.   On: 03/26/2017 19:28   [place] Chest (pleural Effusion)  Result Date: 03/27/2017 CLINICAL DATA:  RIGHT pleural effusion, for thoracentesis EXAM: CHEST ULTRASOUND COMPARISON:  Chest CT 03/26/2017 FINDINGS: Sonography of the RIGHT hemithorax was performed. A thin rim of complicated fluid is seen surrounding the RIGHT lung base, containing scattered internal echoes. Volume of fluid visualized is insufficient for thoracentesis. IMPRESSION: Complex appearing fluid at the inferior RIGHT hemithorax, insufficient volume for thoracentesis. Electronically Signed   By: Lavonia Dana M.D.   On: 03/27/2017 12:51   US Renal  Result Date: 03/22/2017 CLINICAL DATA:  Hypertension.  Acute kidney injury. EXAM: RENAL / URINARY TRACT ULTRASOUND COMPLETE COMPARISON:  None. FINDINGS: Right Kidney: Length: 10.9 cm. Mild cortical thinning. No hydronephrosis. 2 cm cyst in the lower pole. Left Kidney: Length: 11.0 cm. Cortical thickness appears minimally thin end. Multiple cysts, the largest measuring 1.8 cm in the midportion. Bladder: Foley catheter in place. Some ascites is noted.  Probable cirrhosis of the liver. IMPRESSION: Kidneys are normal in size but show mild cortical thinning, right more than left. No hydronephrosis or significant focal lesion. Benign appearing simple cysts. Foley catheter in the bladder. Probable cirrhosis and ascites. Electronically Signed   By: Nelson Chimes M.D.   On: 03/22/2017 14:26   Dg Swallowing Func-speech Pathology  Result Date: 03/29/2017 Objective Swallowing Evaluation: Type of Study: MBS-Modified Barium Swallow Study  Patient Details Name: Obie Kallenbach MRN: [id number] Date of Birth: [birth year] Today's Date: 03/29/2017 Time: SLP Start Time (ACUTE ONLY): 1620 -SLP Stop Time (ACUTE ONLY): 1652 SLP Time Calculation (min) (ACUTE ONLY): 32 min Past Medical History: Past Medical History: Diagnosis Date . Atrial fibrillation with RVR ([place]) 01/04/2014 . CHF (congestive heart failure) ([place])  . COPD (chronic obstructive pulmonary disease) ([place])  . Gout  . History of hiatal hernia  . Hypertension  . Pneumonia X 2 Past Surgical History: Past Surgical History: Procedure Laterality Date . BACK SURGERY   . CATARACT EXTRACTION W/ INTRAOCULAR LENS  IMPLANT, BILATERAL Bilateral 2000's . LUMBAR DISC SURGERY   HPI: 77 year old male with a history of COPD, atrial fibrillation, presented to the hospital with progressive shortness of breath.  Found to have COPD exacerbation as well as community-acquired pneumonia.  He had respiratory failure requiring BiPAP therapy, now on nasal cannula.  He has been treated with steroids, nebs and antibiotics. Hospital course complicated by development of AKI due to hypotension, which has since improved. He is slowly improving. Swallow evaluation pending for dysphagia. Will need SNF placement on discharge. Anticipate discharge in the next 1-2 days.  Subjective: "He says food won't go down." Assessment / Plan / Recommendation CHL IP CLINICAL IMPRESSIONS 03/28/2017  Clinical Impression Pt currently with what is suspected to be a primary esophageal dysphagia. Pt did have a delay in swallow initiation and flash penetration x1 of thin liquids which was not considered abnormal. Of concern was esophageal sweep when pt swallowed barium tablet and esophagus was nearly full of barium material; pill eventually cleared to stomach with numerous follow-up boluses of puree to push it through. Pt is at an increased risk of aspiration due to esophageal function. Recommend dysphagia 2/ thin liquids, meds crushed in puree, supervision to assist with feeding, with strict  esophageal precautions- sitting upright 90 degrees, small bites/ sips, alternate food/ liquid, have pt sit upright at least an hour after meal before reclining. Pt also may benefit from more frequent, smaller meals. Would also highly recommend GI consult to further assess esophageal function. Will f/u to to ensure diet tolerance/ review compensatory strategies.  SLP Visit Diagnosis Dysphagia, pharyngoesophageal phase (R13.14) Attention and concentration deficit following -- Frontal lobe and executive function deficit following -- Impact on safety and function Moderate aspiration risk   CHL IP TREATMENT RECOMMENDATION 03/28/2017 Treatment Recommendations Therapy as outlined in treatment plan below   Prognosis 03/28/2017 Prognosis for Safe Diet Advancement Fair Barriers to Reach Goals Other (Comment) Barriers/Prognosis Comment -- CHL IP DIET RECOMMENDATION 03/28/2017 SLP Diet Recommendations Dysphagia 2 (Fine chop) solids;Thin liquid Liquid Administration via Cup;Straw Medication Administration Crushed with puree Compensations Slow rate;Small sips/bites;Follow solids with liquid Postural Changes Remain semi-upright after after feeds/meals (Comment);Seated upright at 90 degrees   CHL IP OTHER RECOMMENDATIONS 03/28/2017 Recommended Consults Consider esophageal assessment Oral Care Recommendations Oral care BID Other Recommendations Clarify dietary restrictions   CHL IP FOLLOW UP RECOMMENDATIONS 03/28/2017 Follow up Recommendations Skilled Nursing facility   [facility] IP FREQUENCY AND DURATION 03/28/2017 Speech Therapy Frequency (ACUTE ONLY) min 1 x/week Treatment Duration 1 week      CHL IP ORAL PHASE 03/28/2017 Oral Phase Impaired Oral - Pudding Teaspoon -- Oral - Pudding Cup -- Oral - Honey Teaspoon -- Oral - Honey Cup -- Oral - Nectar Teaspoon -- Oral - Nectar Cup -- Oral - Nectar Straw -- Oral - Thin Teaspoon -- Oral - Thin Cup WFL Oral - Thin Straw WFL Oral - Puree WFL Oral - Mech Soft -- Oral - Regular Impaired mastication Oral -  Multi-Consistency -- Oral - Pill Reduced posterior propulsion Oral Phase - Comment --  CHL IP PHARYNGEAL PHASE 03/28/2017 Pharyngeal Phase Impaired Pharyngeal- Pudding Teaspoon -- Pharyngeal -- Pharyngeal- Pudding Cup -- Pharyngeal -- Pharyngeal- Honey Teaspoon -- Pharyngeal -- Pharyngeal- Honey Cup -- Pharyngeal -- Pharyngeal- Nectar Teaspoon -- Pharyngeal -- Pharyngeal- Nectar Cup -- Pharyngeal -- Pharyngeal- Nectar Straw -- Pharyngeal -- Pharyngeal- Thin Teaspoon -- Pharyngeal -- Pharyngeal- Thin Cup WFL Pharyngeal -- Pharyngeal- Thin Straw Delayed swallow initiation-pyriform sinuses;Penetration/Aspiration during swallow Pharyngeal Material enters airway, remains ABOVE vocal cords then ejected out Pharyngeal- Puree WFL Pharyngeal -- Pharyngeal- Mechanical Soft -- Pharyngeal -- Pharyngeal- Regular WFL Pharyngeal -- Pharyngeal- Multi-consistency -- Pharyngeal -- Pharyngeal- Pill WFL Pharyngeal -- Pharyngeal Comment --  CHL IP CERVICAL ESOPHAGEAL PHASE 03/28/2017 Cervical Esophageal Phase Impaired Pudding Teaspoon -- Pudding Cup -- Honey Teaspoon -- Honey Cup -- Nectar Teaspoon -- Nectar Cup -- Nectar Straw -- Thin Teaspoon -- Thin Cup WFL Thin Straw WFL Puree Esophageal backflow into cervical esophagus Mechanical Soft -- Regular Esophageal backflow into cervical esophagus Multi-consistency -- Pill Esophageal backflow into cervical esophagus Cervical Esophageal Comment -- No flowsheet data found. Information added by Genene Churn, study completed by [facility]  Gilmore Laroche, [place] 03/29/2017, 12:54 PM              [place] Ascites (abdomen Limited)  Result Date: 03/27/2017 CLINICAL DATA:  Ascites, history atrial fibrillation, CHF, COPD, hypertension EXAM: LIMITED ABDOMEN ULTRASOUND FOR ASCITES TECHNIQUE: Limited ultrasound survey for ascites was performed in all four abdominal quadrants. COMPARISON:  Abdomen ultrasound 03/24/2017, CT chest 03/26/2017 FINDINGS: No ascites is sonographically identified. IMPRESSION: No  ascites identified. Electronically Signed   By: Lavonia Dana M.D.   On: 03/27/2017 09:34   US Abdomen Limited Ruq  Result Date: 03/24/2017 CLINICAL DATA:  77 year old male with elevated LFTs. Subsequent encounter. EXAM: ULTRASOUND ABDOMEN LIMITED RIGHT UPPER QUADRANT COMPARISON:  03/22/2017 renal sonogram. FINDINGS: Gallbladder: 1.3 cm mobile gallstone. At points gallbladder wall appears thickened measuring up to 5.6 mm. Per ultrasound technologist, patient was not tender over this region during scanning. Common bile duct: Diameter: 4.9 mm Liver: Heterogeneous with minimally lobulated contour raising possibility of cirrhosis with peri hepatic ascites. No dominant liver mass. Portal vein is patent on color Doppler imaging with normal direction of blood flow towards the liver. IMPRESSION: Heterogeneous liver with minimally lobulated contour raising possibility of cirrhosis with peri hepatic ascites. No dominant liver mass. 1.3 cm mobile gallstone. At points gallbladder wall appears thickened measuring up to 5.6 mm. However, per ultrasound technologist, patient was not tender over this region during scanning. These results will be called to the ordering clinician or representative by the Radiologist Assistant, and communication documented in the PACS or zVision Dashboard. Electronically Signed   By: Genia Del M.D.   On: 03/24/2017 10:03    Microbiology: No results found for this or any previous visit (from the past 240 hour(s)).   Labs: Basic Metabolic Panel: Recent Labs  Lab 03/28/17 0544 03/29/17 0732 03/30/17 1448 03/31/17 0547 04/01/17 0532 04/02/17 0611  NA 137 138 137 135 135 137  K 4.6 4.1 4.1 3.7 3.5 4.4  CL 92* 92* 89* 88* 88* 91*  CO2 33* 35* 35* 35* 34* 33*  GLUCOSE 86 122* 100* 92 112* 139*  BUN 40* 35* 36* 41* 56* 81*  CREATININE 0.69 0.66 0.61 0.64 0.71 0.87  CALCIUM 9.4 9.1 9.2 9.2 9.3 9.8  MG 1.6* 1.6* 2.0  --   --   --    Liver Function Tests: Recent Labs  Lab  03/27/17 0459 03/29/17 0732 04/01/17 0532  AST 80* 48* 58*  ALT 199* 127* 116*  ALKPHOS 71 63 65  BILITOT 2.9* 2.9* 3.1*  PROT 6.0* 5.7* 5.6*  ALBUMIN 3.2* 3.2* 3.0*   No results for input(s): LIPASE, AMYLASE in the last 168 hours. No results for input(s): AMMONIA in the last 168 hours. CBC: Recent Labs  Lab 03/27/17 0459 03/28/17 0544  WBC 13.5* 14.7*  HGB 16.3 16.5  HCT 51.4 51.4  MCV 97.5 97.7  PLT 80* 71*   Cardiac Enzymes: No results for input(s): CKTOTAL, CKMB, CKMBINDEX, TROPONINI in the last 168 hours. BNP: BNP (last 3 results) Recent Labs    03/27/17 0459 03/31/17 0544  BNP 518.0* 216.0*    ProBNP (last 3 results) No results for input(s): PROBNP in the last 8760 hours.  CBG: No results for input(s): GLUCAP in the last 168 hours.     Signed:  Lelon Frohlich  [facility] Pager: [phone] 04/02/2017, 2:29 PM

## 2017-04-02 NOTE — Progress Notes (Addendum)
Report called to [facility]. Pt waiting transfer to [facility] via EMS

## 2017-04-02 NOTE — Care Management Important Message (Signed)
Important Message  Patient Details  Name: Brett Carey MRN: [id number] Date of Birth: Jun 27, 1940   Medicare Important Message Given:  Yes    Sherald Barge, RN 04/02/2017, 1:28 PM

## 2017-04-02 NOTE — Clinical Social Work Placement (Signed)
   CLINICAL SOCIAL WORK PLACEMENT  NOTE  Date:  04/02/2017  Patient Details  Name: Brett Carey MRN: [id number] Date of Birth: 1940-09-28  Clinical Social Work is seeking post-discharge placement for this patient at the [place] level of care (*CSW will initial, date and re-position this form in  chart as items are completed):  Yes   Patient/family provided with [place] Work Department's list of facilities offering this level of care within the geographic area requested by the patient (or if unable, by the patient's family).  Yes   Patient/family informed of their freedom to choose among providers that offer the needed level of care, that participate in Medicare, Medicaid or managed care program needed by the patient, have an available bed and are willing to accept the patient.  Yes   Patient/family informed of [facility]'s ownership interest in [facility] and [facility], as well as of the fact that they are under no obligation to receive care at these facilities.  PASRR submitted to EDS on 03/25/17     PASRR number received on 03/25/17     Existing PASRR number confirmed on       FL2 transmitted to all facilities in geographic area requested by pt/family on 03/25/17     FL2 transmitted to all facilities within larger geographic area on       Patient informed that his/her managed care company has contracts with or will negotiate with certain facilities, including the following:        Yes   Patient/family informed of bed offers received.  Patient chooses bed at [facility]     Physician recommends and patient chooses bed at      Patient to be transferred to [facility] on 04/02/17.  Patient to be transferred to facility by [facility]     Patient family notified on 04/02/17 of transfer.  Name of family member notified:  message left for dtr     PHYSICIAN       Additional Comment:     _______________________________________________ Ihor Gully, LCSW 04/02/2017, 3:44 PM

## 2017-04-02 NOTE — Progress Notes (Signed)
Transport on scene to collect and transport the patient to [facility] -patient's daughter Ilona Sorrel) called and informed the patient was in the process of being transported to the facility -[facility] ([place]) called and informed the patient is in route

## 2017-04-02 NOTE — Clinical Social Work Note (Signed)
LCSW notified by MD that pt stable for dc today. Spoke with Sharyn Lull at [facility]. Sharyn Lull confirmed that insurance is authorized and they can accept pt today. Updated MD.  Legrand Como, will follow up this afternoon to further assist with dc plans.

## 2017-04-03 ENCOUNTER — Other Ambulatory Visit: Payer: Self-pay

## 2017-04-03 DIAGNOSIS — M1A00X0 Idiopathic chronic gout, unspecified site, without tophus (tophi): Secondary | ICD-10-CM

## 2017-04-03 DIAGNOSIS — I5043 Acute on chronic combined systolic (congestive) and diastolic (congestive) heart failure: Secondary | ICD-10-CM

## 2017-04-03 DIAGNOSIS — J449 Chronic obstructive pulmonary disease, unspecified: Secondary | ICD-10-CM | POA: Diagnosis not present

## 2017-04-03 DIAGNOSIS — J159 Unspecified bacterial pneumonia: Secondary | ICD-10-CM | POA: Diagnosis not present

## 2017-04-03 DIAGNOSIS — J9611 Chronic respiratory failure with hypoxia: Secondary | ICD-10-CM | POA: Diagnosis not present

## 2017-04-03 HISTORY — DX: Idiopathic chronic gout, unspecified site, without tophus (tophi): M1A.00X0

## 2017-04-03 HISTORY — DX: Acute on chronic combined systolic (congestive) and diastolic (congestive) heart failure: I50.43

## 2017-04-03 MED ORDER — LORAZEPAM 0.5 MG PO TABS: 0.5000 mg | ORAL_TABLET | Freq: Every day | ORAL | 0 refills | Status: AC

## 2017-04-03 MED ORDER — HYDROCODONE-ACETAMINOPHEN 10-325 MG PO TABS: 1.0000 | ORAL_TABLET | Freq: Four times a day (QID) | ORAL | 0 refills | Status: AC

## 2017-04-03 NOTE — Telephone Encounter (Signed)
Rx sent to [facility] phone : [phone] , fax : [phone]  

## 2017-04-05 ENCOUNTER — Other Ambulatory Visit
Admission: RE | Admit: 2017-04-05 | Discharge: 2017-04-05 | Disposition: A | Discharge status: Home / Self Care | Payer: Medicare HMO | Source: Other Acute Inpatient Hospital | Attending: Internal Medicine | Admitting: Internal Medicine

## 2017-04-05 DIAGNOSIS — R410 Disorientation, unspecified: Secondary | ICD-10-CM | POA: Diagnosis not present

## 2017-04-05 LAB — COMPREHENSIVE METABOLIC PANEL WITH GFR
ALT: 79 U/L — ABNORMAL HIGH (ref 17–63)
AST: 60 U/L — ABNORMAL HIGH (ref 15–41)
Albumin: 2.9 g/dL — ABNORMAL LOW (ref 3.5–5.0)
Alkaline Phosphatase: 95 U/L (ref 38–126)
Anion gap: 11 (ref 5–15)
BUN: 44 mg/dL — ABNORMAL HIGH (ref 6–20)
CO2: 29 mmol/L (ref 22–32)
Calcium: 8.3 mg/dL — ABNORMAL LOW (ref 8.9–10.3)
Chloride: 94 mmol/L — ABNORMAL LOW (ref 101–111)
Creatinine, Ser: 0.76 mg/dL (ref 0.61–1.24)
GFR calc Af Amer: >60 mL/min
GFR calc non Af Amer: >60 mL/min
Glucose, Bld: 100 mg/dL — ABNORMAL HIGH (ref 65–99)
Potassium: 3.4 mmol/L — ABNORMAL LOW (ref 3.5–5.1)
Sodium: 134 mmol/L — ABNORMAL LOW (ref 135–145)
Total Bilirubin: 2.7 mg/dL — ABNORMAL HIGH (ref 0.3–1.2)
Total Protein: 5.7 g/dL — ABNORMAL LOW (ref 6.5–8.1)

## 2017-04-05 LAB — CBC
HCT: 38.4 % — ABNORMAL LOW (ref 40.0–52.0)
Hemoglobin: 12.6 g/dL — ABNORMAL LOW (ref 13.0–18.0)
MCH: 31.4 pg (ref 26.0–34.0)
MCHC: 32.8 g/dL (ref 32.0–36.0)
MCV: 95.7 fL (ref 80.0–100.0)
Platelets: 161 K/uL (ref 150–440)
RBC: 4.02 MIL/uL — ABNORMAL LOW (ref 4.40–5.90)
RDW: 17.7 % — ABNORMAL HIGH (ref 11.5–14.5)
WBC: 20.2 K/uL — ABNORMAL HIGH (ref 3.8–10.6)

## 2017-04-08 ENCOUNTER — Other Ambulatory Visit
Admission: RE | Admit: 2017-04-08 | Discharge: 2017-04-08 | Disposition: A | Discharge status: Home / Self Care | Payer: Medicare HMO | Source: Ambulatory Visit | Attending: Gerontology | Admitting: Gerontology

## 2017-04-08 DIAGNOSIS — K7031 Alcoholic cirrhosis of liver with ascites: Secondary | ICD-10-CM | POA: Insufficient documentation

## 2017-04-08 LAB — COMPREHENSIVE METABOLIC PANEL
ALBUMIN: 2.8 g/dL — AB (ref 3.5–5.0)
ALT: 63 U/L (ref 17–63)
AST: 54 U/L — ABNORMAL HIGH (ref 15–41)
Alkaline Phosphatase: 116 U/L (ref 38–126)
Anion gap: 14 (ref 5–15)
BILIRUBIN TOTAL: 3 mg/dL — AB (ref 0.3–1.2)
BUN: 25 mg/dL — ABNORMAL HIGH (ref 6–20)
CO2: 27 mmol/L (ref 22–32)
Calcium: 8.6 mg/dL — ABNORMAL LOW (ref 8.9–10.3)
Chloride: 95 mmol/L — ABNORMAL LOW (ref 101–111)
Creatinine, Ser: 0.76 mg/dL (ref 0.61–1.24)
GFR calc Af Amer: >60 mL/min (ref >60)
GFR calc non Af Amer: >60 mL/min (ref >60)
Glucose, Bld: 117 mg/dL — ABNORMAL HIGH (ref 65–99)
POTASSIUM: 3.4 mmol/L — AB (ref 3.5–5.1)
Sodium: 136 mmol/L (ref 135–145)
TOTAL PROTEIN: 6.2 g/dL — AB (ref 6.5–8.1)

## 2017-04-08 LAB — APTT: aPTT: 38 seconds — ABNORMAL HIGH (ref 24–36)

## 2017-04-08 LAB — CBC WITH DIFFERENTIAL/PLATELET
BASOS ABS: 0 10*3/uL (ref 0–0.1)
BLASTS: 0 %
Band Neutrophils: 2 %
Basophils Relative: 0 %
Eosinophils Absolute: 0.3 10*3/uL (ref 0–0.7)
Eosinophils Relative: 3 %
HEMATOCRIT: 38.4 % — AB (ref 40.0–52.0)
HEMOGLOBIN: 12.8 g/dL — AB (ref 13.0–18.0)
LYMPHS ABS: 0.6 10*3/uL — AB (ref 1.0–3.6)
Lymphocytes Relative: 5 %
MCH: 31.5 pg (ref 26.0–34.0)
MCHC: 33.4 g/dL (ref 32.0–36.0)
MCV: 94.4 fL (ref 80.0–100.0)
METAMYELOCYTES PCT: 0 %
MYELOCYTES: 1 %
Monocytes Absolute: 0.7 10*3/uL (ref 0.2–1.0)
Monocytes Relative: 6 %
Neutro Abs: 9.5 10*3/uL — ABNORMAL HIGH (ref 1.4–6.5)
Neutrophils Relative %: 83 %
Other: 0 %
PLATELETS: 186 10*3/uL (ref 150–440)
PROMYELOCYTES ABS: 0 %
RBC: 4.07 MIL/uL — AB (ref 4.40–5.90)
RDW: 18 % — ABNORMAL HIGH (ref 11.5–14.5)
WBC: 11.1 10*3/uL — AB (ref 3.8–10.6)
nRBC: 0 /100 WBC

## 2017-04-08 LAB — MAGNESIUM: Magnesium: 2.1 mg/dL (ref 1.7–2.4)

## 2017-04-08 LAB — TSH: TSH: 1.94 u[IU]/mL (ref 0.350–4.500)

## 2017-04-09 LAB — VITAMIN B12: VITAMIN B 12: 815 pg/mL (ref 180–914)

## 2017-04-09 LAB — VITAMIN D 25 HYDROXY (VIT D DEFICIENCY, FRACTURES): VIT D 25 HYDROXY: 13.2 ng/mL — AB (ref 30.0–100.0)

## 2017-04-10 ENCOUNTER — Non-Acute Institutional Stay (SKILLED_NURSING_FACILITY): Payer: Medicare HMO | Admitting: Gerontology

## 2017-04-10 DIAGNOSIS — J189 Pneumonia, unspecified organism: Secondary | ICD-10-CM | POA: Diagnosis not present

## 2017-04-11 ENCOUNTER — Encounter: Payer: Self-pay | Admitting: Gerontology

## 2017-04-11 NOTE — Progress Notes (Signed)
Location:   [facility] Room Number: [place] of Service:  SNF [phone])  Provider: Toni Arthurs, NP-C  PCP: Celene Squibb, MD Patient Care Team: Celene Squibb, MD as PCP - General (Internal Medicine)  Extended Emergency Contact Information Primary Emergency Contact: [facility] Address: [address]          [place], [place] [address] Johnnette Litter of [place] Phone: [phone] Relation: Spouse Secondary Emergency Contact: [place] of [place] Phone: [phone] Mobile Phone: [phone] Relation: Daughter  Code Status: DNR Goals of care:  Advanced Directive information Advanced Directives 04/10/2017  Does Patient Have a Medical Advance Directive? Yes  Type of Paramedic of [place];Living will;Out of facility DNR (pink MOST or yellow form)  Does patient want to make changes to medical advance directive? No - Patient declined  Copy of [place]ine in Chart? Yes  Would patient like information on creating a medical advance directive? -     No Known Allergies  Chief Complaint  Patient presents with  . Discharge Note    Discharged from SNF    HPI:  77 y.o. male was seen today for discharge evaluation. Pt was admitted to the facility for rehab following hospitalization for PNA/ Respiratory failure. Pt needing PT/OT for strengthening/ deconditioning. Pt is primary caregiver for wife at home with dementia. Pt was unwilling to go to the ER at onset of symptoms d/t concern for his wife. Daughter arranged caregivers for wife- only then did pt agree to go to the ER. He was found to have PNA, respiratory failure, was in Afib, acute renal failure, alcoholic cirrhosis of the liver with ascites and malnutrition. Pt has been unable to fully participate in PT and OT d/t weakness and dyspnea with minimal exertion. Pt seen sitting still in the chair, in obvious respiratory distress/  tachypnea but denies shortness of breath. Denies chest pain. Denies pain of any kind. He refuses to go back to the hospital at this time, but says he will "if it gets worse." Pt's daughter requests pt be discharged on 2/16, with Hospice. Will make referral. Monitor closely for worsening symptoms.      Past Medical History:  Diagnosis Date  . Acute on chronic combined systolic and diastolic CHF (congestive heart failure) ([place]) 04/03/2017  . Atrial fibrillation with RVR ([place]) 01/04/2014  . CHF (congestive heart failure) ([place])   . Chronic gouty arthritis 04/03/2017  . COPD (chronic obstructive pulmonary disease) ([place])   . Gout   . History of hiatal hernia   . Hypertension   . Pneumonia X 2    Past Surgical History:  Procedure Laterality Date  . BACK SURGERY    . CATARACT EXTRACTION W/ INTRAOCULAR LENS  IMPLANT, BILATERAL Bilateral 2000's  . LUMBAR DISC SURGERY        reports that he has been smoking cigarettes.  He has a 18.81 pack-year smoking history. He has quit using smokeless tobacco. His smokeless tobacco use included chew. He reports that he drinks alcohol. He reports that he does not use drugs. Social History   Socioeconomic History  . Marital status: Married    Spouse name: Barnetta Chapel  . Number of children: 1  . Years of education: 38  . Highest education level: 9th grade  Social Needs  . Financial resource strain: Not on file  . Food insecurity - worry: Not on file  . Food insecurity - inability: Not on  file  . Transportation needs - medical: Not on file  . Transportation needs - non-medical: Not on file  Occupational History  . Not on file  Tobacco Use  . Smoking status: Current Every Day Smoker    Packs/day: 0.33    Years: 57.00    Pack years: 18.81    Types: Cigarettes  . Smokeless tobacco: Former Systems developer    Types: Chew  . Tobacco comment: "never chewed much"  Substance and Sexual Activity  . Alcohol use: Yes    Comment: "drank some; stopped in the 1970's"  .  Drug use: No  . Sexual activity: Not Currently  Other Topics Concern  . Not on file  Social History Narrative  . Not on file   Functional Status Survey:    No Known Allergies  Pertinent  Health Maintenance Due  Topic Date Due  . COLONOSCOPY  02/17/1992  . PNA vac Low Risk Adult (1 of 2 - PCV13) 02/17/2007  . INFLUENZA VACCINE  09/25/2016    Medications: Allergies as of 04/10/2017   No Known Allergies     Medication List        Accurate as of 04/10/17 11:59 PM. Always use your most recent med list.          apixaban 2.5 MG Tabs tablet Commonly known as:  ELIQUIS Take 2.5 mg by mouth 2 (two) times daily.   budesonide-formoterol 80-4.5 MCG/ACT inhaler Commonly known as:  SYMBICORT Inhale 2 puffs into the lungs 2 (two) times daily.   colchicine 0.6 MG tablet Commonly known as:  COLCRYS Take 1 tablet (0.6 mg total) by mouth 2 (two) times daily.   DERMACLOUD Crea Apply liberal amount topically  to area of skin irritation prn. OK to leave at bedside   diltiazem 240 MG 24 hr capsule Commonly known as:  CARDIZEM CD Take 1 capsule (240 mg total) by mouth daily.   ENSURE ENLIVE PO Take 1 Bottle by mouth 2 (two) times daily between meals.   furosemide 40 MG tablet Commonly known as:  LASIX Take 1 tablet (40 mg total) by mouth 2 (two) times daily.   HYDROcodone-acetaminophen 10-325 MG tablet Commonly known as:  NORCO Take 1 tablet by mouth every 6 (six) hours.   LORazepam 0.5 MG tablet Commonly known as:  ATIVAN Take 1 tablet (0.5 mg total) by mouth at bedtime.   metoprolol tartrate 25 MG tablet Commonly known as:  LOPRESSOR Take 0.5 tablets (12.5 mg total) by mouth 2 (two) times daily.   PROAIR HFA 108 (90 Base) MCG/ACT inhaler Generic drug:  albuterol Inhale 2 puffs into the lungs every 6 (six) hours as needed for wheezing or shortness of breath.   SPIRIVA RESPIMAT 2.5 MCG/ACT Aers Generic drug:  Tiotropium Bromide Monohydrate Inhale 2 puffs into the  lungs daily.   Vitamin D 2000 units Caps Take 6,000 Units by mouth daily. 3 caps       Review of Systems  Constitutional: Positive for appetite change and fatigue. Negative for activity change, chills, diaphoresis and fever.  HENT: Negative for congestion, mouth sores, nosebleeds, postnasal drip, sneezing, sore throat, trouble swallowing and voice change.   Respiratory: Positive for shortness of breath. Negative for apnea, cough, choking, chest tightness and wheezing.   Cardiovascular: Negative for chest pain, palpitations and leg swelling.  Gastrointestinal: Negative for abdominal distention, abdominal pain, constipation, diarrhea and nausea.  Genitourinary: Negative for difficulty urinating, dysuria, frequency and urgency.  Musculoskeletal: Negative for back pain, gait problem and myalgias.  Arthralgias: typical arthritis.  Skin: Negative for color change, pallor, rash and wound.  Neurological: Positive for weakness. Negative for dizziness, tremors, syncope, speech difficulty, numbness and headaches.  Psychiatric/Behavioral: Negative for agitation and behavioral problems.  All other systems reviewed and are negative.   Vitals:   04/10/17 1603  BP: 106/60  Pulse: 81  Resp: 18  Temp: (!) 97.5 F (36.4 C)  TempSrc: Oral  SpO2: 99%  Weight: 189 lb 8 oz (86 kg)  Height: 5\' 4"  (1.626 m)   Body mass index is 32.53 kg/m. Physical Exam  Constitutional: He is oriented to person, place, and time. He appears well-developed. He is active and cooperative. He does not appear ill. No distress. Nasal cannula in place.  HENT:  Head: Normocephalic and atraumatic.  Mouth/Throat: Uvula is midline, oropharynx is clear and moist and mucous membranes are normal. Mucous membranes are not pale, not dry and not cyanotic.  Eyes: Conjunctivae, EOM and lids are normal. Pupils are equal, round, and reactive to light.  Neck: Trachea normal, normal range of motion and full passive range of motion without  pain. Neck supple. No JVD present. No tracheal deviation, no edema and no erythema present. No thyromegaly present.  Cardiovascular: Regular rhythm, normal heart sounds, intact distal pulses and normal pulses. Tachycardia present. Exam reveals no gallop, no distant heart sounds and no friction rub.  No murmur heard. Pulses:      Dorsalis pedis pulses are 2+ on the right side, and 2+ on the left side.  No edema  Pulmonary/Chest: Effort normal. No accessory muscle usage. Tachypnea noted. No respiratory distress. He has decreased breath sounds in the right lower field and the left lower field. He has no wheezes. He has no rhonchi. He has no rales. He exhibits no tenderness.  Abdominal: Soft. Normal appearance and bowel sounds are normal. He exhibits ascites. He exhibits no distension. There is no tenderness.  Musculoskeletal: Normal range of motion. He exhibits no edema or tenderness.  Expected osteoarthritis, stiffness; Bilateral Calves soft, supple. Negative Homan's Sign. B- pedal pulses equal; generalized weakness  Neurological: He is alert and oriented to person, place, and time. He has normal strength.  Skin: Skin is warm, dry and intact. He is not diaphoretic. No cyanosis. No pallor. Nails show no clubbing.  Slight jaundice/ waxy appearance  Psychiatric: He has a normal mood and affect. His speech is normal and behavior is normal. Judgment and thought content normal. Cognition and memory are normal.  Nursing note and vitals reviewed.   Labs reviewed: Basic Metabolic Panel: Recent Labs    03/29/17 0732 03/30/17 0646  04/02/17 0611 04/05/17 0912 04/08/17 1245  NA 138 137   < > 137 134* 136  K 4.1 4.1   < > 4.4 3.4* 3.4*  CL 92* 89*   < > 91* 94* 95*  CO2 35* 35*   < > 33* 29 27  GLUCOSE 122* 100*   < > 139* 100* 117*  BUN 35* 36*   < > 81* 44* 25*  CREATININE 0.66 0.61   < > 0.87 0.76 0.76  CALCIUM 9.1 9.2   < > 9.8 8.3* 8.6*  MG 1.6* 2.0  --   --   --  2.1   < > = values in this  interval not displayed.   Liver Function Tests: Recent Labs    04/01/17 0532 04/05/17 0912 04/08/17 1245  AST 58* 60* 54*  ALT 116* 79* 63  ALKPHOS 65 95 116  BILITOT 3.1* 2.7* 3.0*  PROT 5.6* 5.7* 6.2*  ALBUMIN 3.0* 2.9* 2.8*   Recent Labs    03/20/17 1909  LIPASE 25   No results for input(s): AMMONIA in the last 8760 hours. CBC: Recent Labs    03/20/17 1908 03/21/17 0300  03/28/17 0544 04/05/17 0912 04/08/17 1245  WBC 11.2* 11.6*   < > 14.7* 20.2* 11.1*  NEUTROABS 9.3* 10.4*  --   --   --  9.5*  HGB 16.0 16.5   < > 16.5 12.6* 12.8*  HCT 50.0 50.8   < > 51.4 38.4* 38.4*  MCV 98.0 97.1   < > 97.7 95.7 94.4  PLT 163 145*   < > 71* 161 186   < > = values in this interval not displayed.   Cardiac Enzymes: No results for input(s): CKTOTAL, CKMB, CKMBINDEX, TROPONINI in the last 8760 hours. BNP: Invalid input(s): POCBNP CBG: No results for input(s): GLUCAP in the last 8760 hours.  Procedures and Imaging Studies During Stay: Dg Chest 2 View  Result Date: 03/20/2017 CLINICAL DATA:  Generalize weakness with decreased appetite and short of breath EXAM: CHEST  2 VIEW COMPARISON:  01/04/2014 FINDINGS: Suspected moderate right pleural effusion. Cardiomegaly with vascular congestion and mild pulmonary edema. Aortic atherosclerosis. No pneumothorax. Mild degenerative changes of the spine. Airspace disease at the right middle lobe and lung base. IMPRESSION: 1. Cardiomegaly with vascular congestion and mild pulmonary edema. Suspected moderate right pleural effusion 2. Airspace disease at the right middle lobe and lung base could reflect superimposed pneumonia Electronically Signed   By: Donavan Foil M.D.   On: 03/20/2017 19:07   Ct Chest Wo Contrast  Result Date: 03/26/2017 CLINICAL DATA:  COPD and hypoxia.  Evaluate effusions. EXAM: CT CHEST WITHOUT CONTRAST TECHNIQUE: Multidetector CT imaging of the chest was performed following the standard protocol without IV contrast.  COMPARISON:  Chest radiograph from 03/20/2017 FINDINGS: Cardiovascular: There is moderate to marked cardiac enlargement. No pericardial effusion. Aortic atherosclerosis. Calcifications are noted within the LAD Coronary artery. Mediastinum/Nodes: The trachea is patent and midline. Normal appearance of the esophagus. No mediastinal or hilar adenopathy. No axillary or supraclavicular adenopathy. Lungs/Pleura: Moderate changes of emphysema. Small left pleural effusion with overlying subpleural atelectasis. There is a moderate volume partially loculated right pleural effusion. There is compressive type atelectasis and consolidation within the right lower lobe. Complete atelectasis/consolidation of the right middle lobe identified. Upper Abdomen: There is a small volume of perihepatic ascites. Low-attenuation left adrenal nodule measures 2 cm and likely represents a benign adenoma. Musculoskeletal: There is degenerative disc disease noted within the thoracic spine. No aggressive lytic or sclerotic bone lesions. IMPRESSION: 1. Moderate partially loculated right pleural effusion with complete atelectasis/consolidation of the right middle lobe. Followup imaging is advised to ensure re-expansion of the right middle lobe. If this does not improve then consider further evaluation with either bronchoscopy or PET-CT to assess for underlying lung lesion. 2. Small left pleural effusion. 3. Marked cardiac enlargement, aortic atherosclerosis and LAD coronary artery calcifications. Aortic Atherosclerosis (ICD10-I70.0). 4. Small volume of ascites within the right upper quadrant of the abdomen. 5. Diffuse bronchial wall thickening with emphysema, as above; imaging findings suggestive of underlying COPD. Emphysema (ICD10-J43.9). Electronically Signed   By: Kerby Moors M.D.   On: 03/26/2017 19:28   [place] Chest (pleural Effusion)  Result Date: 03/27/2017 CLINICAL DATA:  RIGHT pleural effusion, for thoracentesis EXAM: CHEST ULTRASOUND  COMPARISON:  Chest CT 03/26/2017 FINDINGS: Sonography of the RIGHT hemithorax was  performed. A thin rim of complicated fluid is seen surrounding the RIGHT lung base, containing scattered internal echoes. Volume of fluid visualized is insufficient for thoracentesis. IMPRESSION: Complex appearing fluid at the inferior RIGHT hemithorax, insufficient volume for thoracentesis. Electronically Signed   By: Lavonia Dana M.D.   On: 03/27/2017 12:51   US Renal  Result Date: 03/22/2017 CLINICAL DATA:  Hypertension.  Acute kidney injury. EXAM: RENAL / URINARY TRACT ULTRASOUND COMPLETE COMPARISON:  None. FINDINGS: Right Kidney: Length: 10.9 cm. Mild cortical thinning. No hydronephrosis. 2 cm cyst in the lower pole. Left Kidney: Length: 11.0 cm. Cortical thickness appears minimally thin end. Multiple cysts, the largest measuring 1.8 cm in the midportion. Bladder: Foley catheter in place. Some ascites is noted.  Probable cirrhosis of the liver. IMPRESSION: Kidneys are normal in size but show mild cortical thinning, right more than left. No hydronephrosis or significant focal lesion. Benign appearing simple cysts. Foley catheter in the bladder. Probable cirrhosis and ascites. Electronically Signed   By: Nelson Chimes M.D.   On: 03/22/2017 14:26   Dg Swallowing Func-speech Pathology  Result Date: 03/29/2017 Objective Swallowing Evaluation: Type of Study: MBS-Modified Barium Swallow Study  Patient Details Name: Shaft Corigliano MRN: [id number] Date of Birth: 08/31/1940 Today's Date: 03/29/2017 Time: SLP Start Time (ACUTE ONLY): 1620 -SLP Stop Time (ACUTE ONLY): 1652 SLP Time Calculation (min) (ACUTE ONLY): 32 min Past Medical History: Past Medical History: Diagnosis Date . Atrial fibrillation with RVR ([place]) 01/04/2014 . CHF (congestive heart failure) ([place])  . COPD (chronic obstructive pulmonary disease) ([place])  . Gout  . History of hiatal hernia  . Hypertension  . Pneumonia X 2 Past Surgical History: Past Surgical History: Procedure  Laterality Date . BACK SURGERY   . CATARACT EXTRACTION W/ INTRAOCULAR LENS  IMPLANT, BILATERAL Bilateral 2000's . LUMBAR DISC SURGERY   HPI: 77 year old male with a history of COPD, atrial fibrillation, presented to the hospital with progressive shortness of breath.  Found to have COPD exacerbation as well as community-acquired pneumonia.  He had respiratory failure requiring BiPAP therapy, now on nasal cannula.  He has been treated with steroids, nebs and antibiotics. Hospital course complicated by development of AKI due to hypotension, which has since improved. He is slowly improving. Swallow evaluation pending for dysphagia. Will need SNF placement on discharge. Anticipate discharge in the next 1-2 days.  Subjective: "He says food won't go down." Assessment / Plan / Recommendation CHL IP CLINICAL IMPRESSIONS 03/28/2017 Clinical Impression Pt currently with what is suspected to be a primary esophageal dysphagia. Pt did have a delay in swallow initiation and flash penetration x1 of thin liquids which was not considered abnormal. Of concern was esophageal sweep when pt swallowed barium tablet and esophagus was nearly full of barium material; pill eventually cleared to stomach with numerous follow-up boluses of puree to push it through. Pt is at an increased risk of aspiration due to esophageal function. Recommend dysphagia 2/ thin liquids, meds crushed in puree, supervision to assist with feeding, with strict esophageal precautions- sitting upright 90 degrees, small bites/ sips, alternate food/ liquid, have pt sit upright at least an hour after meal before reclining. Pt also may benefit from more frequent, smaller meals. Would also highly recommend GI consult to further assess esophageal function. Will f/u to to ensure diet tolerance/ review compensatory strategies.  SLP Visit Diagnosis Dysphagia, pharyngoesophageal phase (R13.14) Attention and concentration deficit following -- Frontal lobe and executive function  deficit following -- Impact on safety and function Moderate aspiration  risk   CHL IP TREATMENT RECOMMENDATION 03/28/2017 Treatment Recommendations Therapy as outlined in treatment plan below   Prognosis 03/28/2017 Prognosis for Safe Diet Advancement Fair Barriers to Reach Goals Other (Comment) Barriers/Prognosis Comment -- CHL IP DIET RECOMMENDATION 03/28/2017 SLP Diet Recommendations Dysphagia 2 (Fine chop) solids;Thin liquid Liquid Administration via Cup;Straw Medication Administration Crushed with puree Compensations Slow rate;Small sips/bites;Follow solids with liquid Postural Changes Remain semi-upright after after feeds/meals (Comment);Seated upright at 90 degrees   CHL IP OTHER RECOMMENDATIONS 03/28/2017 Recommended Consults Consider esophageal assessment Oral Care Recommendations Oral care BID Other Recommendations Clarify dietary restrictions   CHL IP FOLLOW UP RECOMMENDATIONS 03/28/2017 Follow up Recommendations Skilled Nursing facility   [facility] IP FREQUENCY AND DURATION 03/28/2017 Speech Therapy Frequency (ACUTE ONLY) min 1 x/week Treatment Duration 1 week      CHL IP ORAL PHASE 03/28/2017 Oral Phase Impaired Oral - Pudding Teaspoon -- Oral - Pudding Cup -- Oral - Honey Teaspoon -- Oral - Honey Cup -- Oral - Nectar Teaspoon -- Oral - Nectar Cup -- Oral - Nectar Straw -- Oral - Thin Teaspoon -- Oral - Thin Cup WFL Oral - Thin Straw WFL Oral - Puree WFL Oral - Mech Soft -- Oral - Regular Impaired mastication Oral - Multi-Consistency -- Oral - Pill Reduced posterior propulsion Oral Phase - Comment --  CHL IP PHARYNGEAL PHASE 03/28/2017 Pharyngeal Phase Impaired Pharyngeal- Pudding Teaspoon -- Pharyngeal -- Pharyngeal- Pudding Cup -- Pharyngeal -- Pharyngeal- Honey Teaspoon -- Pharyngeal -- Pharyngeal- Honey Cup -- Pharyngeal -- Pharyngeal- Nectar Teaspoon -- Pharyngeal -- Pharyngeal- Nectar Cup -- Pharyngeal -- Pharyngeal- Nectar Straw -- Pharyngeal -- Pharyngeal- Thin Teaspoon -- Pharyngeal -- Pharyngeal- Thin Cup WFL  Pharyngeal -- Pharyngeal- Thin Straw Delayed swallow initiation-pyriform sinuses;Penetration/Aspiration during swallow Pharyngeal Material enters airway, remains ABOVE vocal cords then ejected out Pharyngeal- Puree WFL Pharyngeal -- Pharyngeal- Mechanical Soft -- Pharyngeal -- Pharyngeal- Regular WFL Pharyngeal -- Pharyngeal- Multi-consistency -- Pharyngeal -- Pharyngeal- Pill WFL Pharyngeal -- Pharyngeal Comment --  CHL IP CERVICAL ESOPHAGEAL PHASE 03/28/2017 Cervical Esophageal Phase Impaired Pudding Teaspoon -- Pudding Cup -- Honey Teaspoon -- Honey Cup -- Nectar Teaspoon -- Nectar Cup -- Nectar Straw -- Thin Teaspoon -- Thin Cup WFL Thin Straw WFL Puree Esophageal backflow into cervical esophagus Mechanical Soft -- Regular Esophageal backflow into cervical esophagus Multi-consistency -- Pill Esophageal backflow into cervical esophagus Cervical Esophageal Comment -- No flowsheet data found. Information added by Genene Churn, study completed by Blair Heys, SLP PORTER,DABNEY 03/29/2017, 12:54 PM              [place] Ascites (abdomen Limited)  Result Date: 03/27/2017 CLINICAL DATA:  Ascites, history atrial fibrillation, CHF, COPD, hypertension EXAM: LIMITED ABDOMEN ULTRASOUND FOR ASCITES TECHNIQUE: Limited ultrasound survey for ascites was performed in all four abdominal quadrants. COMPARISON:  Abdomen ultrasound 03/24/2017, CT chest 03/26/2017 FINDINGS: No ascites is sonographically identified. IMPRESSION: No ascites identified. Electronically Signed   By: Lavonia Dana M.D.   On: 03/27/2017 09:34   US Abdomen Limited Ruq  Result Date: 03/24/2017 CLINICAL DATA:  77 year old male with elevated LFTs. Subsequent encounter. EXAM: ULTRASOUND ABDOMEN LIMITED RIGHT UPPER QUADRANT COMPARISON:  03/22/2017 renal sonogram. FINDINGS: Gallbladder: 1.3 cm mobile gallstone. At points gallbladder wall appears thickened measuring up to 5.6 mm. Per ultrasound technologist, patient was not tender over this region during scanning.  Common bile duct: Diameter: 4.9 mm Liver: Heterogeneous with minimally lobulated contour raising possibility of cirrhosis with peri hepatic ascites. No dominant liver mass. Portal vein is patent on  color Doppler imaging with normal direction of blood flow towards the liver. IMPRESSION: Heterogeneous liver with minimally lobulated contour raising possibility of cirrhosis with peri hepatic ascites. No dominant liver mass. 1.3 cm mobile gallstone. At points gallbladder wall appears thickened measuring up to 5.6 mm. However, per ultrasound technologist, patient was not tender over this region during scanning. These results will be called to the ordering clinician or representative by the Radiologist Assistant, and communication documented in the PACS or zVision Dashboard. Electronically Signed   By: Genia Del M.D.   On: 03/24/2017 10:03    Assessment/Plan:    Pneumonia due to infectious organism, unspecified laterality, unspecified part of lung   Palliative Care consult RE: Discussions of Code status, evaluate for Hospice appropriateness   If appropriate per Palliative:  Hospice referral for services at Home through [facility]  Pt will need Hospital bed, Home O2  Open to services asap  Morphine 20 mg/ mL- 0.25 mL po/SL Q 1 hour prn pain, dyspnea, 30 mL bottle, no refill  Lorazepam 0.5 mg 1 tablet po Q 4 hours prn- anxiety, agitation, dyspnea, #30, no refill  DNR  O2 2.5 liters/ min [place]    Patient is being discharged with the following home health services:  [facility]  Patient is being discharged with the following durable medical equipment: Hospital bed, O2   Patient has been advised to f/u with their PCP in 1-2 weeks to bring them up to date on their rehab stay.  Social services at facility was responsible for arranging this appointment.  Pt was provided with a 30 day supply of prescriptions for medications and refills must be obtained from their PCP.   For controlled substances, a more limited supply may be provided adequate until PCP appointment only.  Future labs/tests needed:    Family/ staff Communication:   Total Time:  Documentation:  Face to Face:  Family/Phone:  Vikki Ports, NP-C [facility] [address], [place] [address] Cell Phone (Mon-Fri 8am-5pm):  [phone] On Call:  [phone] & follow prompts after 5pm & weekends Office Phone:  [phone] Office Fax:  [phone]

## 2017-04-25 DEATH — deceased

## 2019-09-06 IMAGING — US US ABDOMEN LIMITED
1 series · 2 of 2 positions shown · non-contrast
Comparison: Abdomen ultrasound 03/24/2017, CT chest 03/26/2017

CLINICAL DATA: Ascites, history atrial fibrillation, CHF, COPD,
hypertension

EXAM:
LIMITED ABDOMEN ULTRASOUND FOR ASCITES
TECHNIQUE: Limited ultrasound survey for ascites was performed in all four
abdominal quadrants.

[Series 1: us abdomen limited · 0.28mm/px · 2 of 2 slices shown]
[im 1/2]
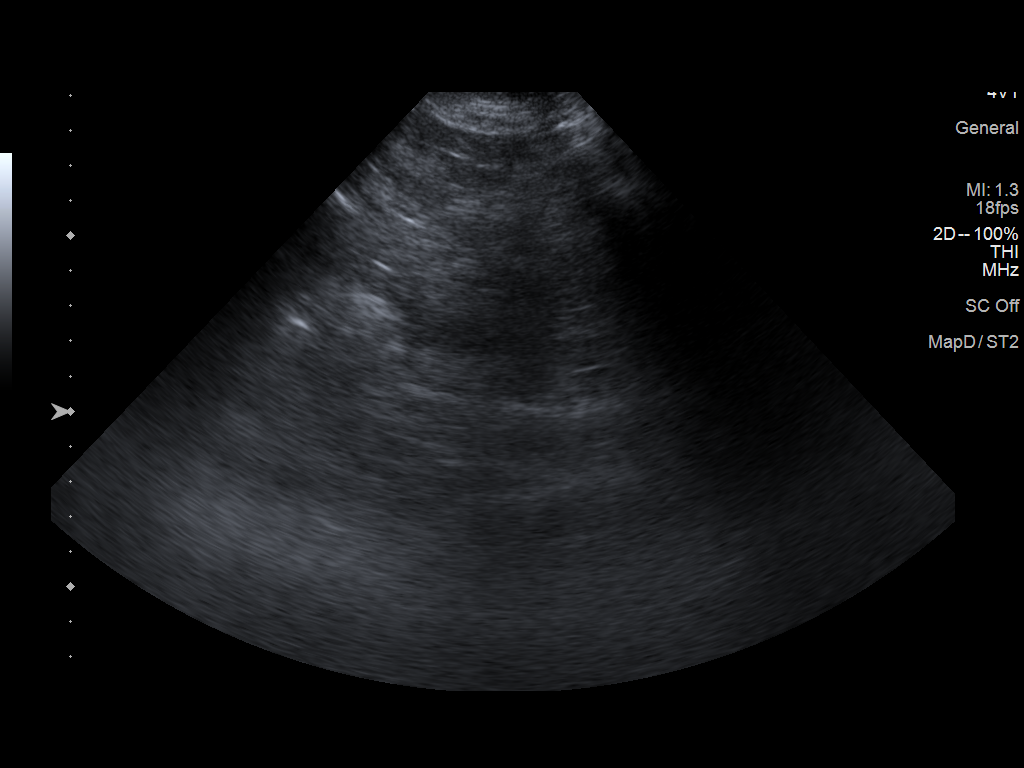
[im 2/2]
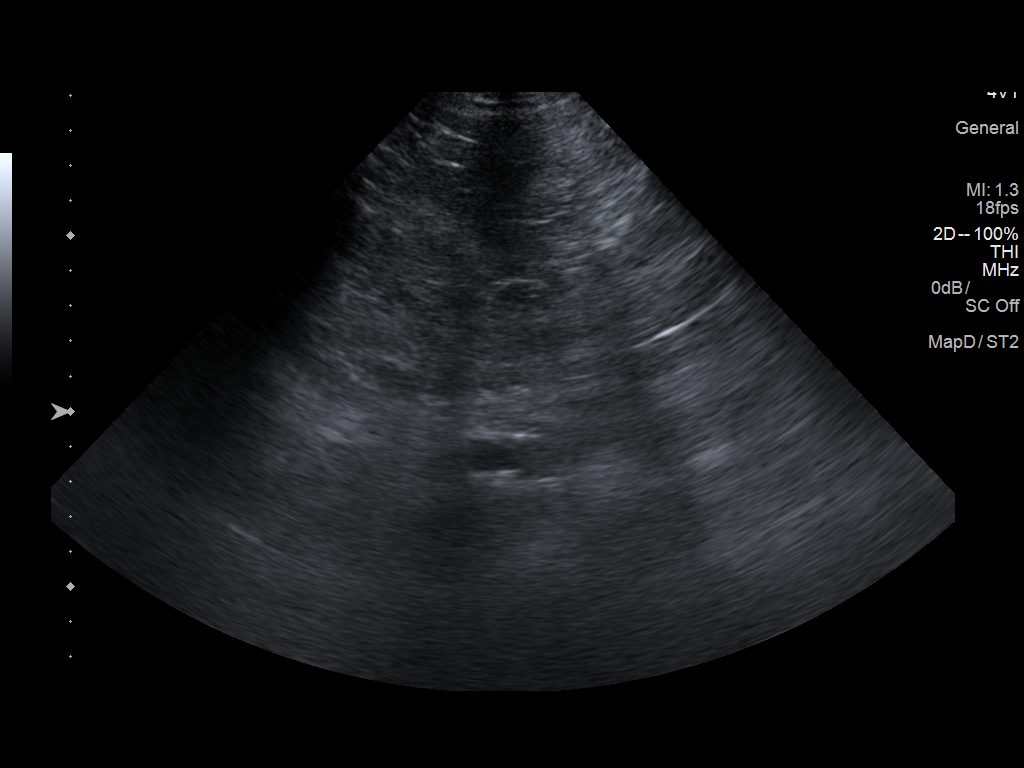

[2 of 2 positions shown; findings below may reference images not displayed]

FINDINGS: No ascites is sonographically identified.
IMPRESSION: No ascites identified.

## 2019-12-02 IMAGING — CT CT CHEST W/O CM
2 of 4 series · 15 of 36 positions shown, 18 images · non-contrast
Comparison: Chest radiograph from 03/20/2017

CLINICAL DATA: COPD and hypoxia.  Evaluate effusions.

EXAM:
CT CHEST WITHOUT CONTRAST
TECHNIQUE: Multidetector CT imaging of the chest was performed following the
standard protocol without IV contrast.

[Series 2: thorax · axial · 0.78mm/px · z∈[-352,-72]mm · 12 of 166 slices shown, 15 images]
[im 13/166  mediastinal]
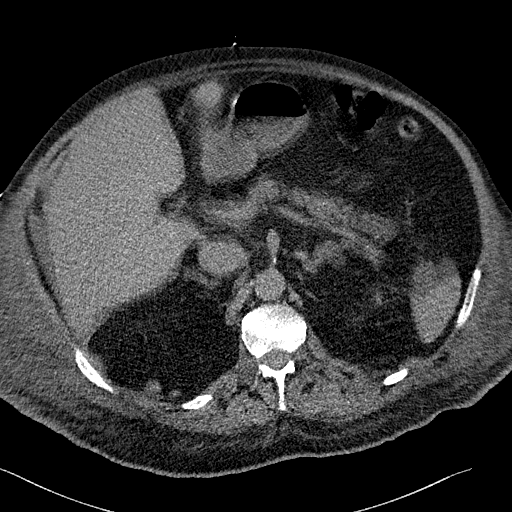
[im 13/166  lung]
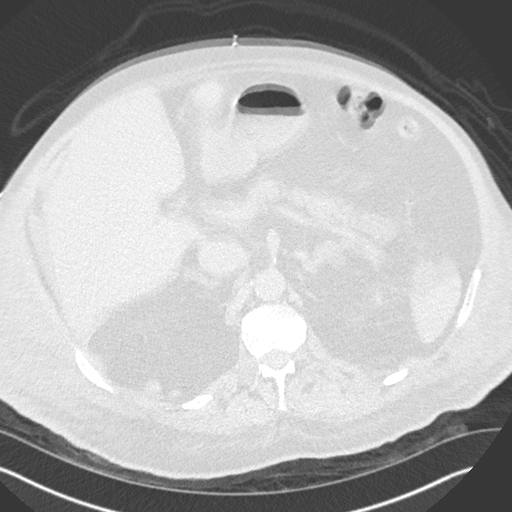
[im 26/166  lung]
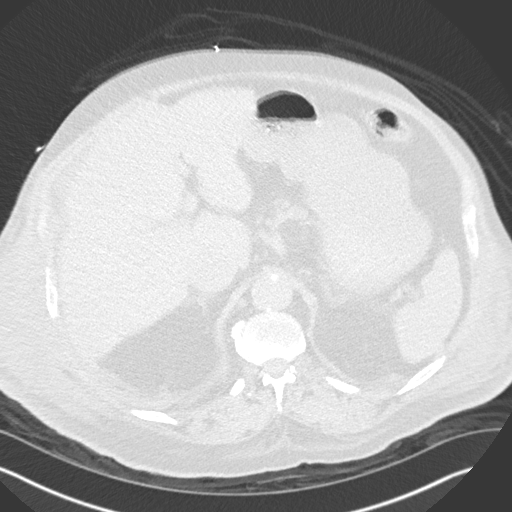
[im 39/166  lung]
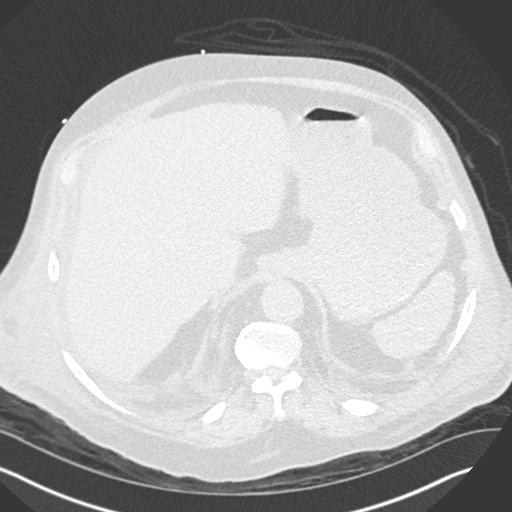
[im 51/166  lung]
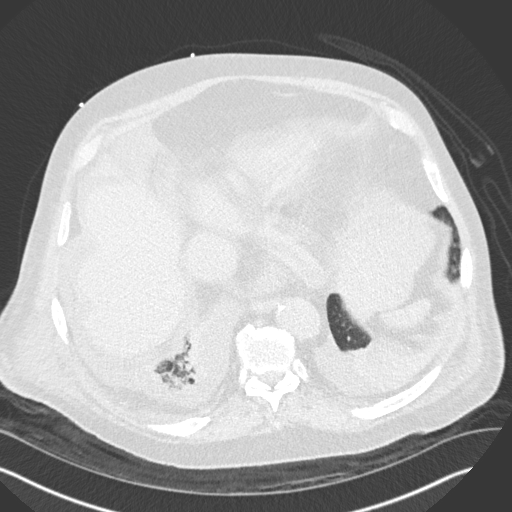
[im 64/166  mediastinal]
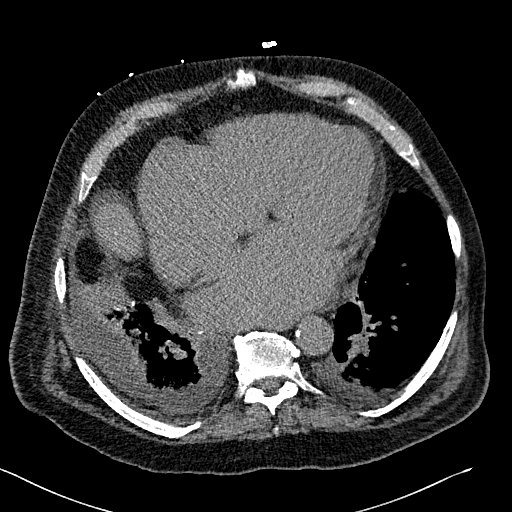
[im 64/166  lung]
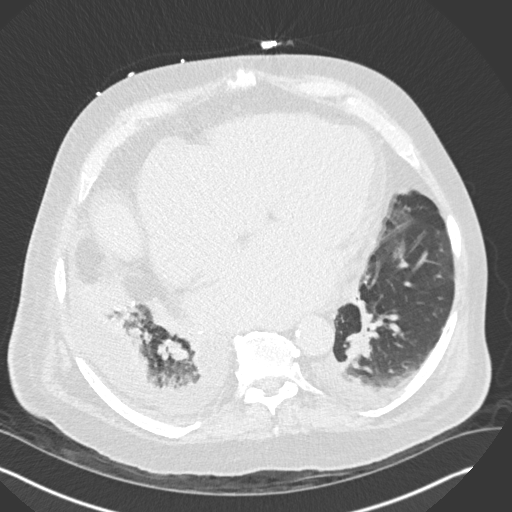
[im 77/166  lung]
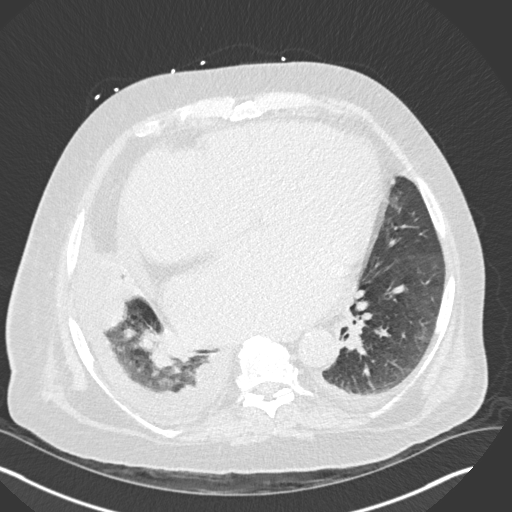
[im 89/166  lung]
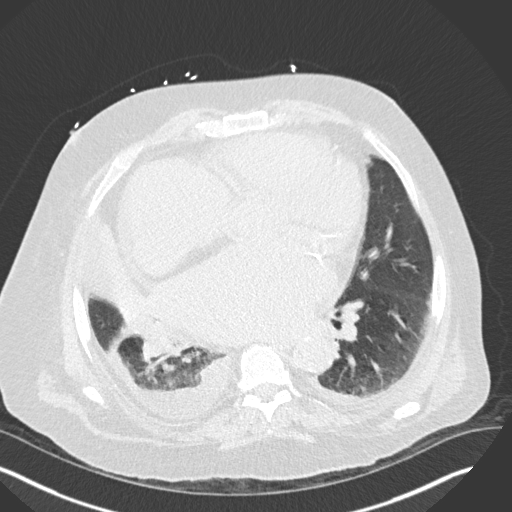
[im 102/166  lung]
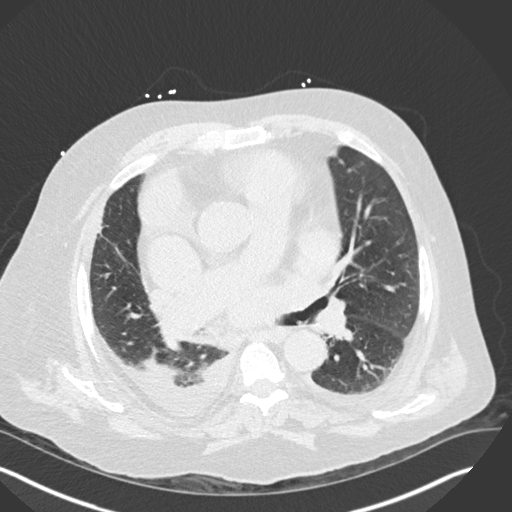
[im 115/166  mediastinal]
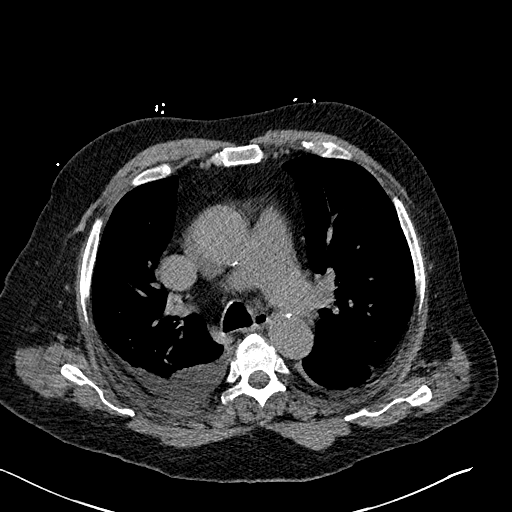
[im 115/166  lung]
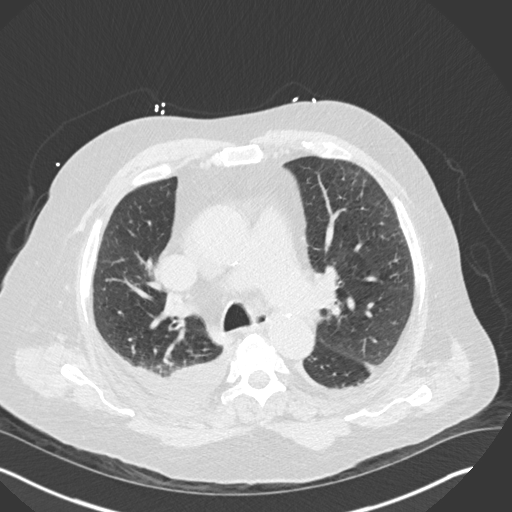
[im 127/166  lung]
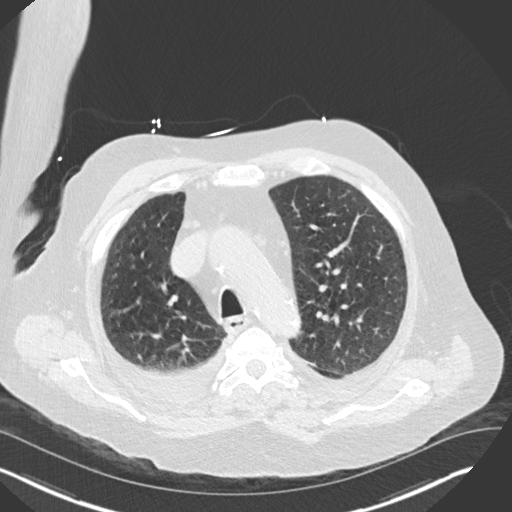
[im 140/166  lung]
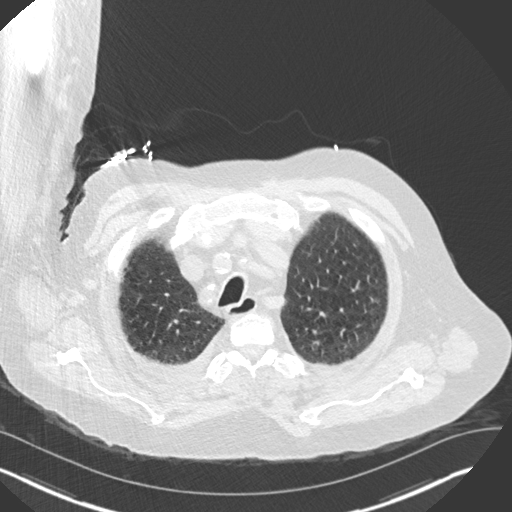
[im 153/166  lung]
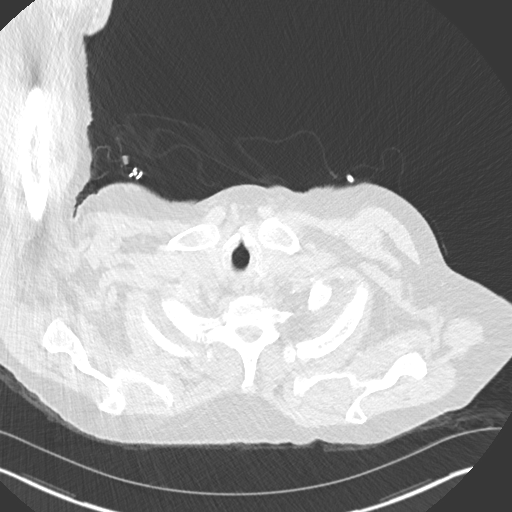

[Series 5: coronal · coronal · 0.69mm/px · 3 of 151 slices shown]
[im 31/151  lung]
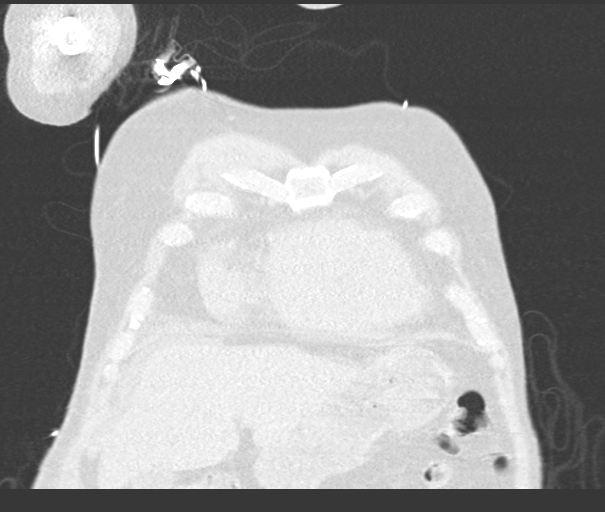
[im 61/151  lung]
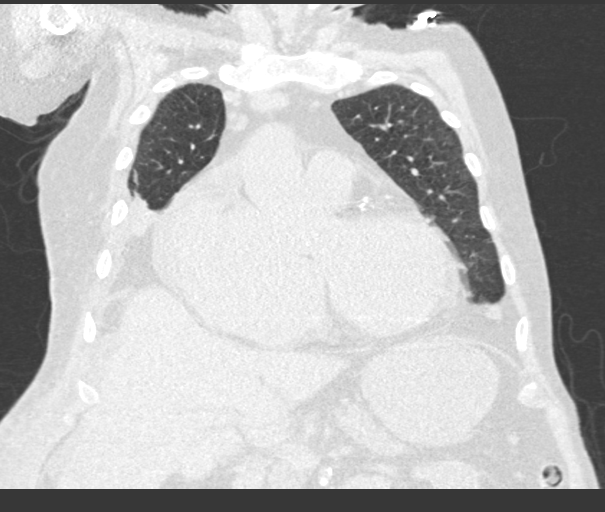
[im 91/151  lung]
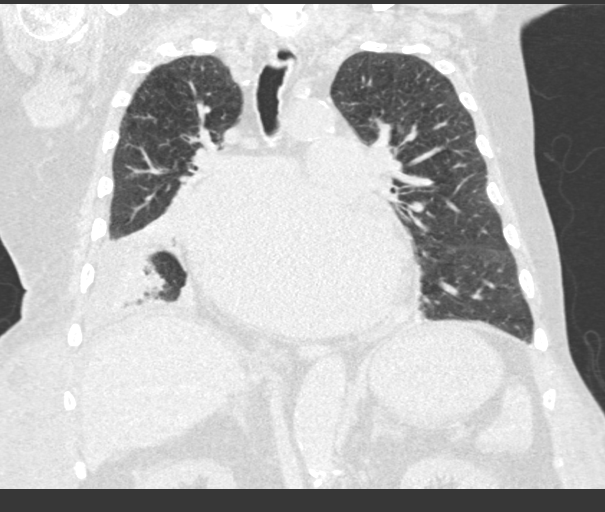

[15 of 36 positions shown; findings below may reference images not displayed]

FINDINGS: Cardiovascular: There is moderate to marked cardiac enlargement. No
pericardial effusion. Aortic atherosclerosis. Calcifications are
noted within the LAD Coronary artery.

Mediastinum/Nodes: The trachea is patent and midline. Normal
appearance of the esophagus. No mediastinal or hilar adenopathy. No
axillary or supraclavicular adenopathy.

Lungs/Pleura: Moderate changes of emphysema.

Small left pleural effusion with overlying subpleural atelectasis.
There is a moderate volume partially loculated right pleural
effusion. There is compressive type atelectasis and consolidation
within the right lower lobe. Complete atelectasis/consolidation of
the right middle lobe identified.

Upper Abdomen: There is a small volume of perihepatic ascites.
Low-attenuation left adrenal nodule measures 2 cm and likely
represents a benign adenoma.

Musculoskeletal: There is degenerative disc disease noted within the
thoracic spine. No aggressive lytic or sclerotic bone lesions.
IMPRESSION: 1. Moderate partially loculated right pleural effusion with complete
atelectasis/consolidation of the right middle lobe. Followup imaging
is advised to ensure re-expansion of the right middle lobe. If this
does not improve then consider further evaluation with either
bronchoscopy or PET-CT to assess for underlying lung lesion.
2. Small left pleural effusion.
3. Marked cardiac enlargement, aortic atherosclerosis and LAD
coronary artery calcifications. Aortic Atherosclerosis
([id number]).
4. Small volume of ascites within the right upper quadrant of the
abdomen.
5. Diffuse bronchial wall thickening with emphysema, as above;
imaging findings suggestive of underlying COPD. Emphysema
([id number]).
